# Patient Record
Sex: Female | Born: 1955 | Race: White | Hispanic: No | Marital: Married
Health system: Midwestern US, Academic
[De-identification: ages and names within clinical notes are randomized; demographics above are authoritative.]

---

## 2017-05-02 MED ORDER — METOPROLOL TARTRATE 25 MG PO TAB
ORAL_TABLET | Freq: Two times a day (BID) | 3 refills
Start: 2017-05-02 — End: ?

## 2017-05-05 MED ORDER — ISOSORBIDE MONONITRATE 30 MG PO TB24
ORAL_TABLET | ORAL | 1 refills | 30.00000 days | Status: DC
Start: 2017-05-05 — End: 2018-10-13

## 2017-05-05 MED ORDER — CLOPIDOGREL 75 MG PO TAB
ORAL_TABLET | Freq: Every day | ORAL | 1 refills | 90.00000 days | Status: DC
Start: 2017-05-05 — End: 2017-07-17

## 2017-07-03 ENCOUNTER — Encounter: Admit: 2017-07-03 | Discharge: 2017-07-03 | Payer: Commercial Managed Care - PPO

## 2017-07-16 ENCOUNTER — Encounter: Admit: 2017-07-16 | Discharge: 2017-07-16 | Payer: Commercial Managed Care - PPO

## 2017-07-16 DIAGNOSIS — E785 Hyperlipidemia, unspecified: ICD-10-CM

## 2017-07-16 DIAGNOSIS — I2510 Atherosclerotic heart disease of native coronary artery without angina pectoris: Principal | ICD-10-CM

## 2017-07-16 DIAGNOSIS — I10 Essential (primary) hypertension: ICD-10-CM

## 2017-07-16 LAB — HEMOGLOBIN A1C: Lab: 9.1 — ABNORMAL HIGH (ref 4.5–6.5)

## 2017-07-16 LAB — AST (SGOT): Lab: 11 % — ABNORMAL LOW (ref 24–44)

## 2017-07-16 LAB — COMPREHENSIVE METABOLIC PANEL
Lab: 0.5
Lab: 11
Lab: 126
Lab: 13
Lab: 139
Lab: 145 — ABNORMAL HIGH (ref 80–115)
Lab: 16 — ABNORMAL HIGH (ref 0–14)
Lab: 24
Lab: 3.5
Lab: 73
Lab: 84
Lab: 9.9
Lab: 0.8 — ABNORMAL LOW (ref 33.0–37.0)
Lab: 16
Lab: 7.4 — ABNORMAL HIGH (ref 11.5–14.5)

## 2017-07-16 LAB — LIPID PROFILE
Lab: 257 g/dL — ABNORMAL HIGH (ref 150–200)
Lab: 42 10*3/uL (ref 150–400)
Lab: 484 % — ABNORMAL HIGH (ref 30–200)
Lab: 6 % — ABNORMAL HIGH (ref 0–5)

## 2017-07-16 LAB — CBC: Lab: 7.2

## 2017-07-16 LAB — THYROID STIMULATING HORMONE-TSH: Lab: 1.6

## 2017-07-16 LAB — ALT (SGPT): Lab: 13 FL — ABNORMAL HIGH (ref <100)

## 2017-07-17 ENCOUNTER — Ambulatory Visit: Admit: 2017-07-17 | Discharge: 2017-07-18 | Payer: Commercial Managed Care - PPO

## 2017-07-17 ENCOUNTER — Encounter: Admit: 2017-07-17 | Discharge: 2017-07-17 | Payer: Commercial Managed Care - PPO

## 2017-07-17 DIAGNOSIS — I10 Essential (primary) hypertension: ICD-10-CM

## 2017-07-17 DIAGNOSIS — I208 Other forms of angina pectoris: ICD-10-CM

## 2017-07-17 DIAGNOSIS — E785 Hyperlipidemia, unspecified: ICD-10-CM

## 2017-07-17 DIAGNOSIS — F329 Major depressive disorder, single episode, unspecified: ICD-10-CM

## 2017-07-17 DIAGNOSIS — I2510 Atherosclerotic heart disease of native coronary artery without angina pectoris: Principal | ICD-10-CM

## 2017-07-17 MED ORDER — ATORVASTATIN 80 MG PO TAB
80 mg | ORAL_TABLET | Freq: Every day | ORAL | 3 refills | Status: AC
Start: 2017-07-17 — End: 2018-11-03

## 2017-07-17 NOTE — Assessment & Plan Note
She is not having any angina and wants to simplify her medical program.  She is gradually been tapering off the Ranexa so we discontinued it today.  If she remains free of any angina symptoms she could potentially stop the Imdur as well.

## 2017-07-17 NOTE — Assessment & Plan Note
Unfortunately we did not get back to her pharmacy about refilling the atorvastatin and she has been off the statin for several months.  Her lipid profile shows that she definitely needs it so I got her a new prescription today.

## 2017-07-17 NOTE — Assessment & Plan Note
Blood pressure looks good on the current medical program.

## 2017-07-17 NOTE — Progress Notes
Date of Service: 07/17/2017    Nicole Pace is a 61 y.o. female.       HPI     Nicole Pace was in the [place] office today for follow-up regarding her coronary disease.  We have had to treat her with a fairly intensive antianginal program in the past for a pretty prominent vasoactive component.  This seems to have settled down because she has been tapering her Ranexa dosage, primarily because of the cost of the medication.  She is interested in trying to simplify her medical program so I did make some changes today.    Unfortunately her statin did not get refilled and she has been off of the drug for several months.  Her lipid profile does not look very good but I did go ahead and send in the prescription for more atorvastatin today.    She is not having any trouble at all with chest discomfort and she denies any breathlessness, palpitations, or lightheadedness.  She has had no TIA or stroke symptoms.         Vitals:    07/17/17 1308   BP: 128/82   Pulse: 91   Weight: 132 kg (291 lb)   Height: 1.715 m (5' 7.5)     Body mass index is 44.9 kg/m???.     Past Medical History  Patient Active Problem List    Diagnosis Date Noted   ??? 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 Venous insufficiency 04/08/2013   ??? Chest pain at rest 12/31/2012   ??? 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 CAD (coronary artery disease) 10/03/2010     05/02/09 Heart Cath Single vessel disease:  LAD(mid) 85% discrete lesion treated with 3.5x59mm Med[employer] endeavor sprint DES stent.  07/2010  Abnormal stress thallium  (antero-apical ischemia)  09/04/10 Heart Cath:  Non-obstructive Coronary disease, Mild ISR LAD(mid) tubular 20% lesion, LV EF 65%  12/2012 Cath:  LAD stent patent.  No significant obstructive lesions     ??? Hypertension 10/03/2010   ??? Hyperlipidemia 10/03/2010   ??? Diabetes mellitus, type 2 (HCC) 10/03/2010   ??? Depression 10/03/2010   ??? Sleep apnea 10/03/2010     Sleep study 09/13/09 Recommendations CPAP with 14cm Review of Systems   Constitution: Negative.   HENT: Negative.    Eyes: Negative.    Cardiovascular: Positive for leg swelling.   Respiratory: Positive for cough.    Endocrine: Negative.    Hematologic/Lymphatic: Negative.    Skin: Negative.    Musculoskeletal: Negative.    Gastrointestinal: Negative.    Genitourinary: Negative.    Neurological: Negative.    Psychiatric/Behavioral: Negative.    Allergic/Immunologic: Negative.        Physical Exam    Physical Exam   General Appearance: no distress   Skin: warm, no ulcers or xanthomas   Digits and Nails: no cyanosis or clubbing   Eyes: conjunctivae and lids normal, pupils are equal and round   Teeth/Gums/Palate: dentition unremarkable, no lesions   Lips & Oral Mucosa: no pallor or cyanosis   Neck Veins: normal JVP , neck veins are not distended   Thyroid: no nodules, masses, tenderness or enlargement   Chest Inspection: chest is normal in appearance   Respiratory Effort: breathing comfortably, no respiratory distress   Auscultation/Percussion: lungs clear to auscultation, no rales or rhonchi, no wheezing   PMI: PMI not enlarged or displaced   Cardiac Rhythm: regular rhythm and normal rate   Cardiac Auscultation: S1, S2 normal, no  rub, no gallop   Murmurs: no murmur   Peripheral Circulation: normal peripheral circulation   Carotid Arteries: normal carotid upstroke bilaterally, no bruits   Radial Arteries: normal symmetric radial pulses   Abdominal Aorta: no abdominal aortic bruit   Pedal Pulses: normal symmetric pedal pulses   Lower Extremity Edema: no lower extremity edema   Abdominal Exam: soft, non-tender, no masses, bowel sounds normal   Liver & Spleen: no organomegaly   Gait & Station: walks without assistance   Muscle Strength: normal muscle tone   Orientation: oriented to time, place and person   Affect & Mood: appropriate and sustained affect   Language and Memory: patient responsive and seems to comprehend information Neurologic Exam: neurological assessment grossly intact   Other: moves all extremities        Problems Addressed Today  Encounter Diagnoses   Name Primary?   ??? Coronary artery disease involving native coronary artery of native heart without angina pectoris    ??? Essential hypertension    ??? Hyperlipidemia, unspecified hyperlipidemia type    ??? Atypical angina (HCC)        Assessment and Plan       CAD (coronary artery disease)  She is not having any angina and wants to simplify her medical program.  She is gradually been tapering off the Ranexa so we discontinued it today.  If she remains free of any angina symptoms she could potentially stop the Imdur as well.    Hyperlipidemia  Unfortunately we did not get back to her pharmacy about refilling the atorvastatin and she has been off the statin for several months.  Her lipid profile shows that she definitely needs it so I got her a new prescription today.    Hypertension  Blood pressure looks good on the current medical program.      Current Medications (including today's revisions)  ??? aspirin EC 81 mg tablet Take 1 Tab by mouth daily. Take with food.   ??? atorvastatin (LIPITOR) 80 mg tablet Take 1 tablet by mouth daily.   ??? Cholecalciferol (Vitamin D3) (VITAMIN D-3) 1,000 unit chew Chew  by mouth.   ??? dimenhyDRINATE(+) 50 mg tab Take 50 mg by mouth every 6 hours as needed.   ??? diphenhydrAMINE (BENADRYL) 25 mg PO capsule Take 50 mg by mouth every 6 hours as needed.   ??? EPINEPHrine (EPIPEN) 1 mg/mL injection pen (2-Pack) Inject 0.3 mg into the muscle once as needed. Inject 0.3 mg (1 Pen) into thigh if needed for anaphylactic reaction. May repeat in 5-15 minutes if needed.   ??? erythromycin (E-MYCIN) 250 mg tablet Take 250 mg by mouth daily.   ??? ezetimibe (ZETIA) 10 mg tablet Take 1 tablet by mouth daily.   ??? FLUoxetine (PROZAC) 20 mg capsule Take 20 mg by mouth daily.     ??? glimepiride (AMARYL) 2 mg tablet Take 2 mg by mouth daily with breakfast. ??? HYDROcodone/acetaminophen (NORCO) 5/325 mg tablet Take 1 tablet by mouth every 4 hours as needed for Pain   ??? ibuprofen (ADVIL) 200 mg tablet Take 200 mg by mouth every 6 hours as needed for Pain. Take with food.   ??? INSULIN DETEMIR (LEVEMIR SC) Inject 25 Units into area(s) as directed at bedtime daily.   ??? isosorbide mononitrate SR (IMDUR) 30 mg tablet TAKE 1 TABLET BY MOUTH IN THE MORNING   ??? metFORMIN (GLUCOPHAGE) 1,000 mg tablet Take 1,000 mg by mouth twice daily with meals.   ??? mv,Ca,min/iron/FA/guarana/caff (ONE-A-DAY WOMEN'S  ACTIVE PO) Take  by mouth.   ??? nitroglycerin (NITROSTAT) 0.4 mg tablet Place 1 Tab under tongue every 5 minutes as needed for Chest Pain.   ??? omeprazole DR(+) (PRILOSEC) 20 mg capsule Take 20 mg by mouth twice daily.   ??? pregabalin (LYRICA) 50 mg capsule Take 50 mg by mouth three times daily.

## 2017-07-22 ENCOUNTER — Encounter: Admit: 2017-07-22 | Discharge: 2017-07-22 | Payer: Commercial Managed Care - PPO

## 2017-10-07 ENCOUNTER — Encounter: Admit: 2017-10-07 | Discharge: 2017-10-07 | Payer: Commercial Managed Care - PPO

## 2017-10-07 MED ORDER — EZETIMIBE 10 MG PO TAB
10 mg | ORAL_TABLET | Freq: Every day | ORAL | 10 refills | Status: AC
Start: 2017-10-07 — End: 2018-11-06

## 2018-10-13 ENCOUNTER — Encounter: Admit: 2018-10-13 | Discharge: 2018-10-13 | Payer: Commercial Managed Care - PPO

## 2018-10-13 ENCOUNTER — Ambulatory Visit: Admit: 2018-10-13 | Discharge: 2018-10-14 | Payer: Commercial Managed Care - PPO

## 2018-10-13 DIAGNOSIS — I10 Essential (primary) hypertension: ICD-10-CM

## 2018-10-13 DIAGNOSIS — E785 Hyperlipidemia, unspecified: ICD-10-CM

## 2018-10-13 DIAGNOSIS — F329 Major depressive disorder, single episode, unspecified: ICD-10-CM

## 2018-10-13 DIAGNOSIS — I209 Angina pectoris, unspecified: Principal | ICD-10-CM

## 2018-10-13 DIAGNOSIS — I2510 Atherosclerotic heart disease of native coronary artery without angina pectoris: Principal | ICD-10-CM

## 2018-10-13 MED ORDER — NITROGLYCERIN 0.4 MG SL SUBL
.4 mg | ORAL_TABLET | SUBLINGUAL | 3 refills | 9.00000 days | Status: AC | PRN
Start: 2018-10-13 — End: ?

## 2018-10-16 ENCOUNTER — Encounter: Admit: 2018-10-16 | Discharge: 2018-10-16 | Payer: Commercial Managed Care - PPO

## 2018-10-20 LAB — COMPREHENSIVE METABOLIC PANEL
Lab: 0.5
Lab: 0.9
Lab: 10
Lab: 10
Lab: 103
Lab: 113
Lab: 13
Lab: 140
Lab: 15 — ABNORMAL HIGH (ref 0–14)
Lab: 26
Lab: 274 — ABNORMAL HIGH (ref 80–115)
Lab: 3.4
Lab: 6.7
Lab: 65
Lab: 9.3

## 2018-10-20 LAB — HEMOGLOBIN A1C: Lab: 9.9 — ABNORMAL HIGH (ref 4.5–6.5)

## 2018-11-03 ENCOUNTER — Encounter: Admit: 2018-11-03 | Discharge: 2018-11-03 | Payer: Commercial Managed Care - PPO

## 2018-11-03 DIAGNOSIS — I208 Other forms of angina pectoris: ICD-10-CM

## 2018-11-03 DIAGNOSIS — I10 Essential (primary) hypertension: ICD-10-CM

## 2018-11-03 DIAGNOSIS — I2510 Atherosclerotic heart disease of native coronary artery without angina pectoris: Principal | ICD-10-CM

## 2018-11-03 DIAGNOSIS — E785 Hyperlipidemia, unspecified: ICD-10-CM

## 2018-11-03 MED ORDER — ATORVASTATIN 80 MG PO TAB
ORAL_TABLET | Freq: Every day | 0 refills | Status: AC
Start: 2018-11-03 — End: 2019-04-28

## 2018-11-06 ENCOUNTER — Encounter: Admit: 2018-11-06 | Discharge: 2018-11-06 | Payer: Commercial Managed Care - PPO

## 2018-11-06 MED ORDER — EZETIMIBE 10 MG PO TAB
ORAL_TABLET | Freq: Every day | 3 refills | Status: AC
Start: 2018-11-06 — End: 2019-05-24

## 2019-01-25 IMAGING — CR LOW_EXM
3 series · 3 of 3 positions shown · non-contrast
Comparison: none

[knee ap]
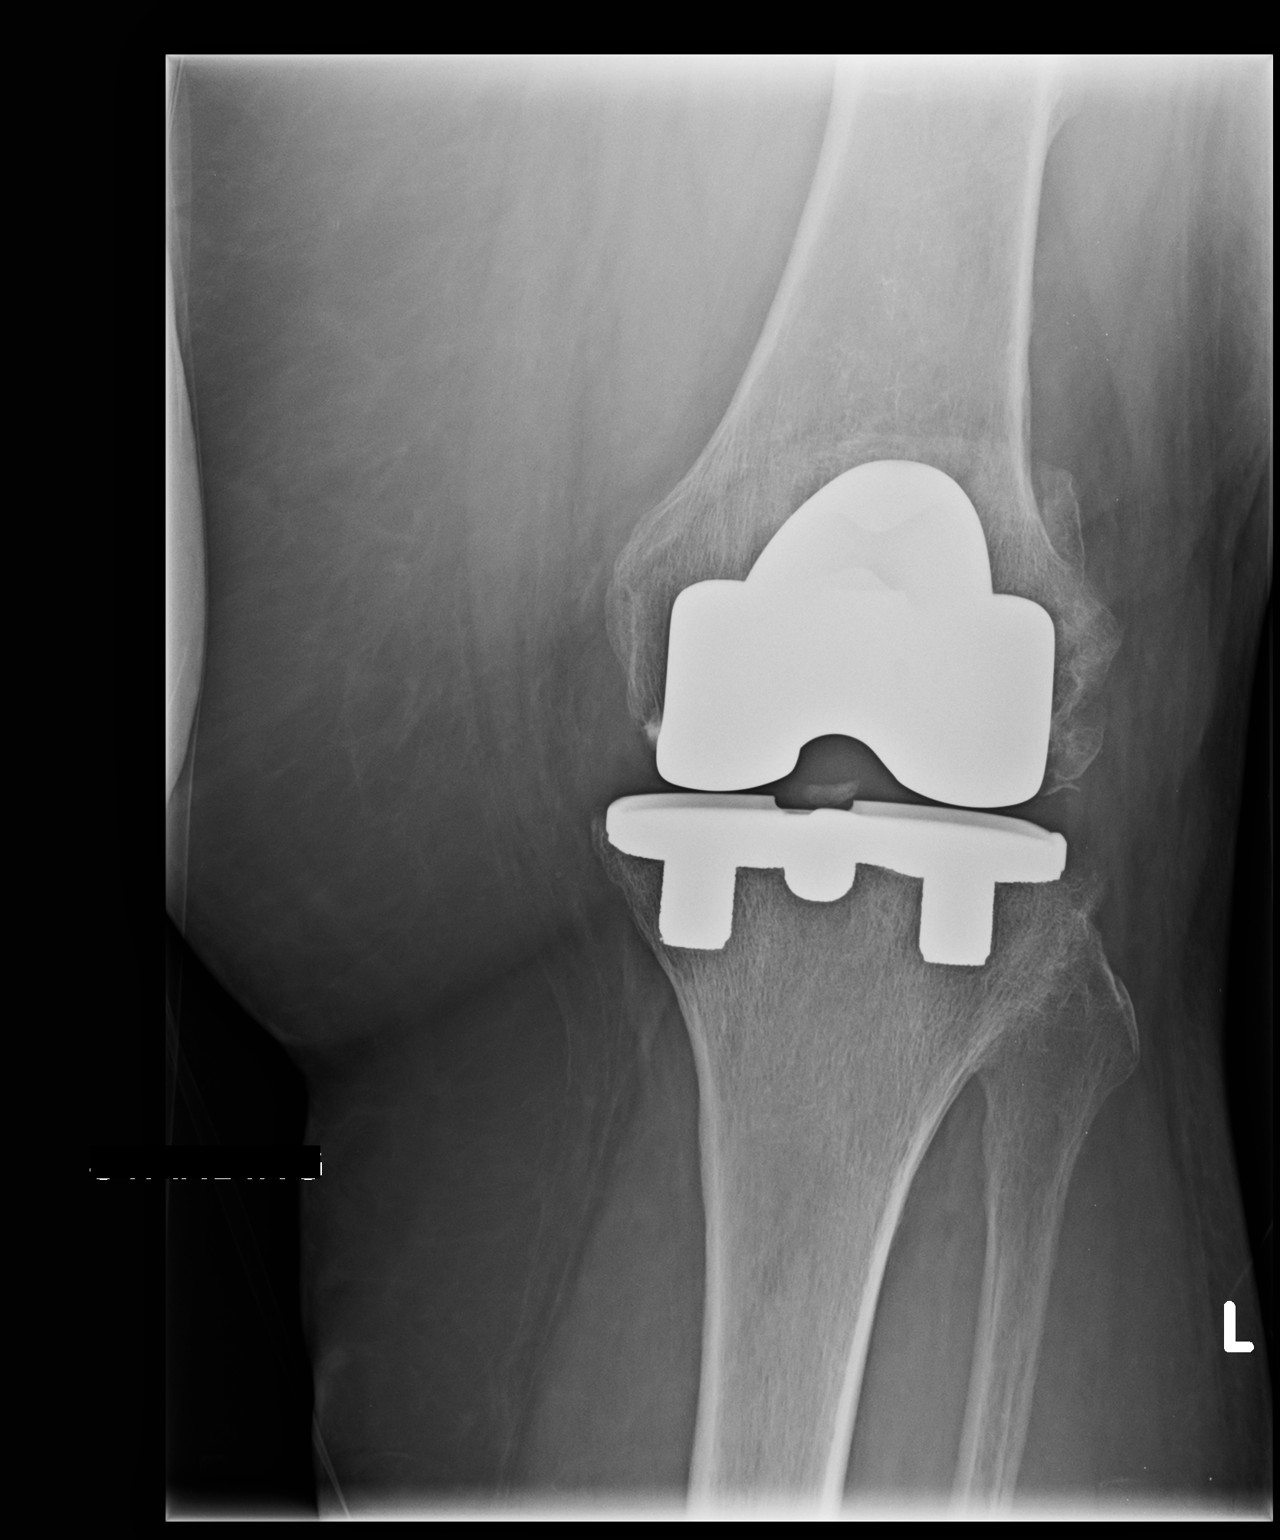

[knee sunrise]
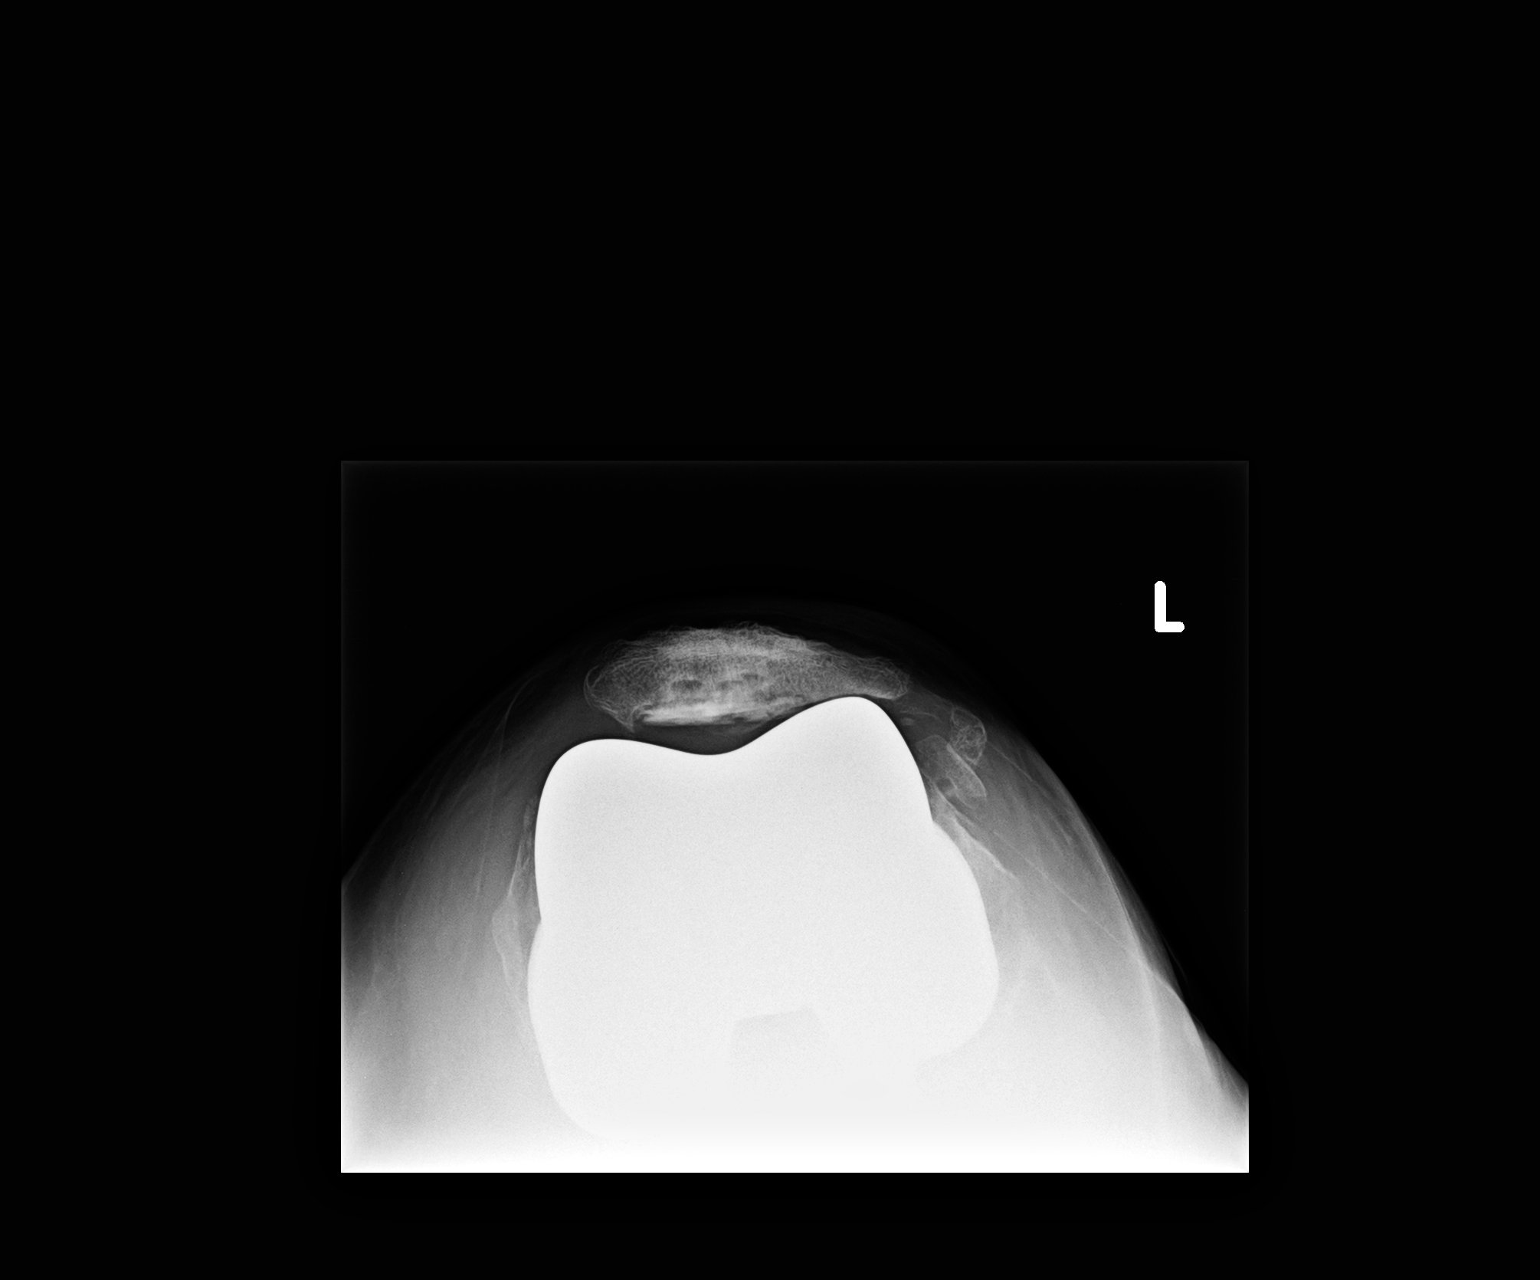

[knee lat]
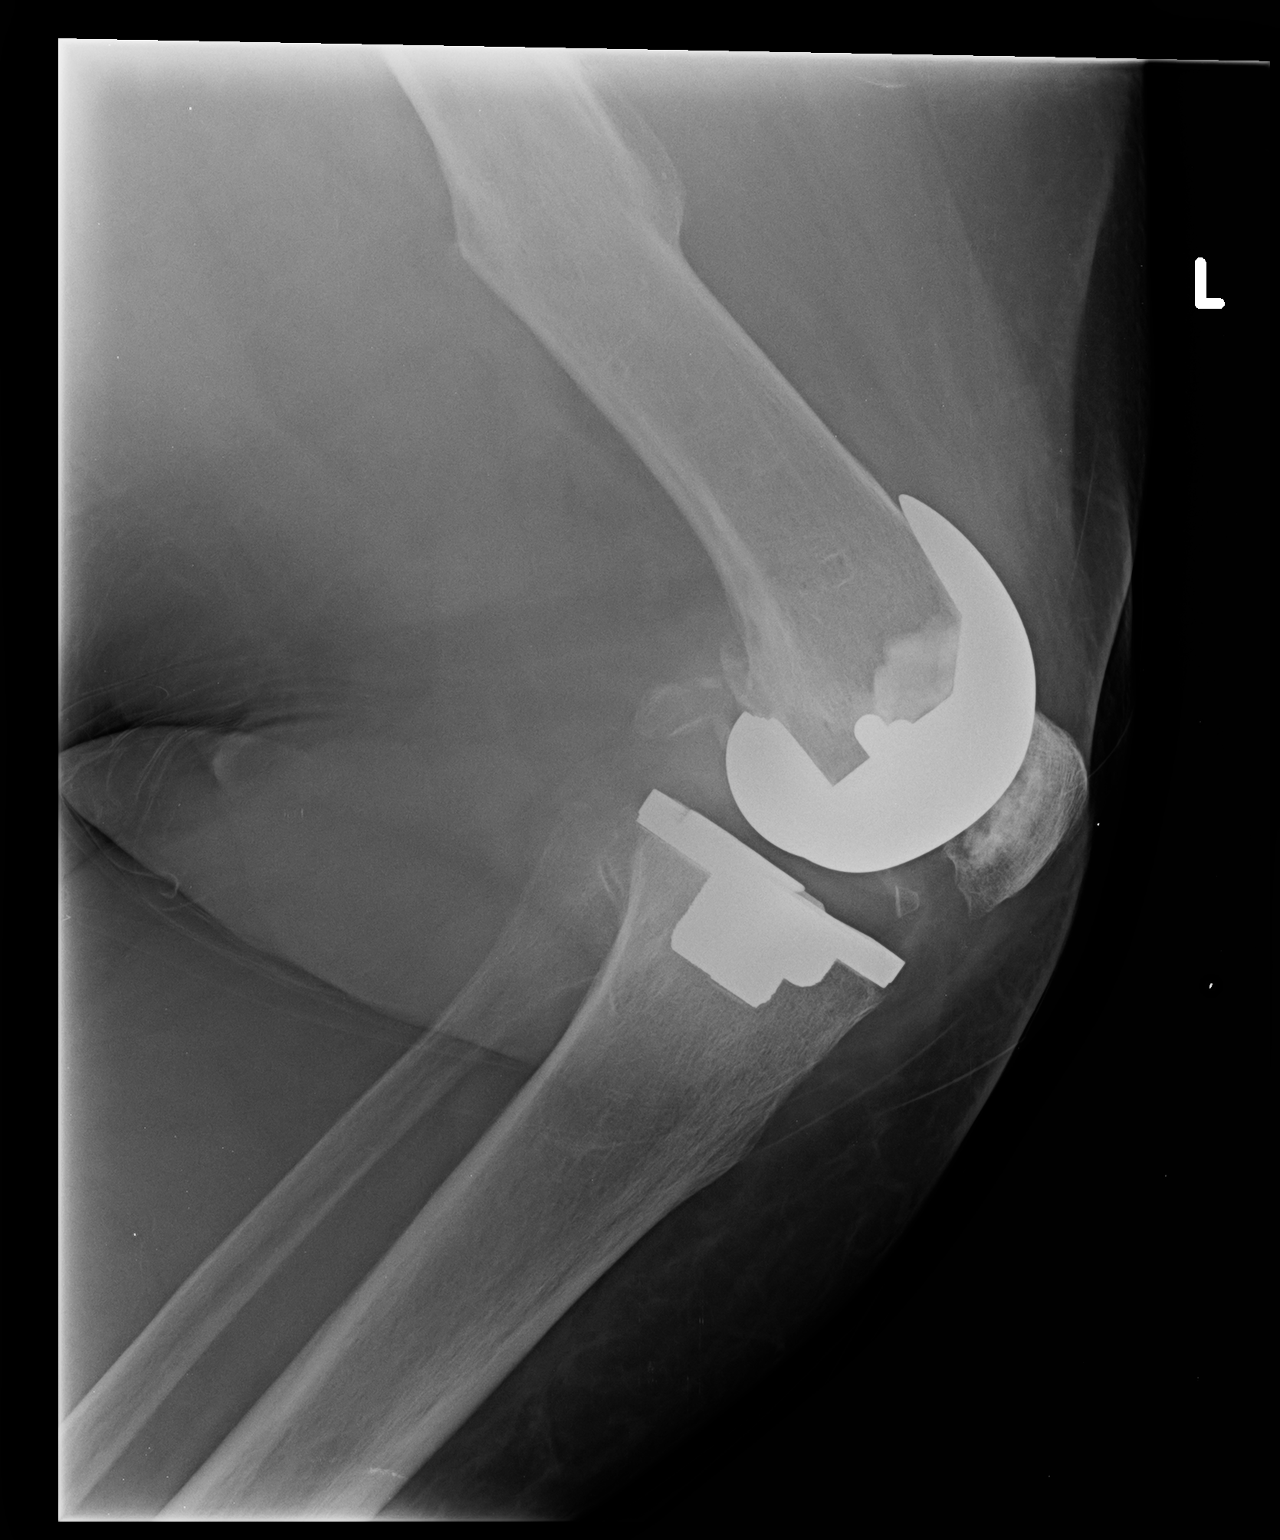

[3 of 3 positions shown; findings below may reference images not displayed]

DIAGNOSTIC STUDIES

EXAM

Left knee:

INDICATION

left knee pain
PT STATES KNEE POPPED WHILE STANDING. PT STATES SEVERE PAIN IN LT KNEE
SINCE INJURY MOSTLY WHEN SITTING. TKA LT KNEE IN 8038. SB/CK

COMPARISONS

03/10/2017

FINDINGS

Total knee arthroplasty. Healed fracture involving the distal femoral metaphysis. No significant
joint effusion.

IMPRESSION

No significant change compared to the prior study.

Follow up with an orthopedic consultation as clinically indicated.

## 2019-02-08 ENCOUNTER — Encounter: Admit: 2019-02-08 | Discharge: 2019-02-08 | Payer: Commercial Managed Care - PPO

## 2019-02-11 ENCOUNTER — Encounter: Admit: 2019-02-11 | Discharge: 2019-02-11 | Payer: Commercial Managed Care - PPO

## 2019-02-11 ENCOUNTER — Ambulatory Visit: Admit: 2019-02-11 | Discharge: 2019-02-12 | Payer: Commercial Managed Care - PPO

## 2019-02-11 DIAGNOSIS — F329 Major depressive disorder, single episode, unspecified: ICD-10-CM

## 2019-02-11 DIAGNOSIS — R079 Chest pain, unspecified: Principal | ICD-10-CM

## 2019-02-11 DIAGNOSIS — I2510 Atherosclerotic heart disease of native coronary artery without angina pectoris: Principal | ICD-10-CM

## 2019-02-11 DIAGNOSIS — E785 Hyperlipidemia, unspecified: ICD-10-CM

## 2019-02-11 DIAGNOSIS — I10 Essential (primary) hypertension: ICD-10-CM

## 2019-02-11 DIAGNOSIS — I208 Other forms of angina pectoris: ICD-10-CM

## 2019-02-11 DIAGNOSIS — R0789 Other chest pain: ICD-10-CM

## 2019-02-11 LAB — TROPONIN-I: Lab: 0

## 2019-02-11 MED ORDER — DOCUSATE SODIUM 100 MG PO CAP
100 mg | Freq: Every day | ORAL | 0 refills | Status: CN | PRN
Start: 2019-02-11 — End: ?

## 2019-02-11 MED ORDER — ASPIRIN 325 MG PO TAB
325 mg | Freq: Once | ORAL | 0 refills | Status: CN
Start: 2019-02-11 — End: ?

## 2019-02-11 MED ORDER — NITROGLYCERIN 0.3 MG SL SUBL
.4 mg | SUBLINGUAL | 0 refills | Status: CN | PRN
Start: 2019-02-11 — End: ?

## 2019-02-11 MED ORDER — ALUMINUM-MAGNESIUM HYDROXIDE 200-200 MG/5 ML PO SUSP
30 mL | ORAL | 0 refills | Status: CN | PRN
Start: 2019-02-11 — End: ?

## 2019-02-11 MED ORDER — ACETAMINOPHEN 325 MG PO TAB
650 mg | ORAL | 0 refills | Status: CN | PRN
Start: 2019-02-11 — End: ?

## 2019-02-11 MED ORDER — TEMAZEPAM 7.5 MG PO CAP
15 mg | Freq: Every evening | ORAL | 0 refills | Status: CN | PRN
Start: 2019-02-11 — End: ?

## 2019-02-11 NOTE — Progress Notes
Date of Service: 02/11/2019    Nicole Pace is a 63 y.o. female.       HPI     Nicole Pace was in the [place] office today for a work in visit regarding chest pain.  At about 3 AM Monday she developed retrosternal pressure that radiated into the back and up into the neck.  She took a total of 3 sublingual nitroglycerin tablets about 10 minutes apart and eventually the third tablet caused the discomfort to dissipate to the point that she fell asleep.  She spoke to 1 of our nurses the following day who strongly recommended that she go to the emergency department should she have any recurrence.    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  Then again this morning she had another similar episode, again not lasting very long.    She says that the symptoms she has had this week do remind her of the symptoms she had before her LAD stent 10 years ago.    She is otherwise been doing okay.  She has been taking all of her medications as prescribed.  Her blood pressure has been doing okay and she denies any recent problems with palpitations, lightheadedness, or syncope.  She has had no TIA or stroke symptoms.  There is been no recent increase in peripheral edema or weight gain.         Vitals:    02/11/19 1011 02/11/19 1030   BP: (!) 120/92 120/86   BP Source: Arm, Left Upper Arm, Right Upper   Pulse: 92    SpO2: 98%    Weight: 118.9 kg (262 lb 3.2 oz)    Height: 1.702 m (5' 7)    PainSc: Two      Body mass index is 41.07 kg/m???.     Past Medical History  Patient Active Problem List    Diagnosis Date Noted   ??? 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 Venous insufficiency 04/08/2013   ??? Chest pain at rest 12/31/2012   ??? 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 CAD (coronary artery disease) 10/03/2010 We need to follow-up on this--I don't think she was on the current cholesterol-lowering meds, but if her numbers still look like this we need to start pre-authorization for [facility].      Current Medications (including today's revisions)  ??? acetaminophen (TYLENOL) 325 mg tablet Take 2 tablets by mouth as Needed.   ??? aspirin EC 81 mg tablet Take 1 Tab by mouth daily. Take with food.   ??? atorvastatin (LIPITOR) 80 mg tablet TAKE 1 TABLET BY MOUTH EVERY DAY   ??? diclofenac (VOLTAREN) 1 % topical gel Apply 4 g topically to affected area four times daily.   ??? dimenhyDRINATE(+) 50 mg tab Take 50 mg by mouth every 6 hours as needed.   ??? diphenhydrAMINE (BENADRYL) 25 mg PO capsule Take 50 mg by mouth every 6 hours as needed.   ??? EPINEPHrine (EPIPEN) 1 mg/mL injection pen (2-Pack) Inject 0.3 mg into the muscle once as needed. Inject 0.3 mg (1 Pen) into thigh if needed for anaphylactic reaction. May repeat in 5-15 minutes if needed.   ??? ezetimibe (ZETIA) 10 mg tablet TAKE 1 TABLET BY MOUTH EVERY DAY   ??? FLUoxetine (PROZAC) 20 mg capsule Take 20 mg  by mouth daily.     ??? glimepiride (AMARYL) 4 mg tablet Take 1 tablet by mouth twice daily.   ??? HYDROcodone/acetaminophen (NORCO) 5/325 mg tablet Take 1 tablet by mouth every 4 hours as needed for Pain   ??? ibuprofen (ADVIL) 200 mg tablet Take 200 mg by mouth every 6 hours as needed for Pain. Take with food.   ??? INSULIN DETEMIR (LEVEMIR SC) Inject 28 Units under the skin twice daily.   ??? metFORMIN (GLUCOPHAGE) 1,000 mg tablet Take 1,000 mg by mouth twice daily with meals.   ??? mv,Ca,min/iron/FA/guarana/caff (ONE-A-DAY WOMEN'S ACTIVE PO) Take 2 tablets by mouth daily.   ??? nitroglycerin (NITROSTAT) 0.4 mg tablet Place one tablet under tongue every 5 minutes as needed for Chest Pain.   ??? omeprazole DR(+) (PRILOSEC) 20 mg capsule Take 20 mg by mouth twice daily.   ??? pregabalin (LYRICA) 50 mg capsule Take 50 mg by mouth three times daily.

## 2019-02-11 NOTE — Progress Notes
Belinda with [employer], [phone], confirmed benefits and eligibility:  Current and active since 12/23/2016, $1500 deductible with required co-insurance of 20% to coinsurance max OOP $2500, or Total annual out of pocket $4000, then plan will pay 100% of allowable charges.  Pre-certification through [employer], [phone], is required for [facility] [address]. Reference Belinda 02/11/2019 11:30    Clydie Braun, cardiology review nurse for [employer], after extensive clinical review gave approval for procedure, valid for a single date of service between 02/11/2019 - 05/12/2019.  Authorization #[id number]

## 2019-02-12 ENCOUNTER — Encounter: Admit: 2019-02-12 | Discharge: 2019-02-12 | Payer: Commercial Managed Care - PPO

## 2019-02-12 ENCOUNTER — Ambulatory Visit: Admit: 2019-02-12 | Discharge: 2019-02-12 | Payer: Commercial Managed Care - PPO

## 2019-02-12 ENCOUNTER — Encounter: Admit: 2019-02-12 | Discharge: 2019-02-13 | Payer: Commercial Managed Care - PPO

## 2019-02-12 DIAGNOSIS — E785 Hyperlipidemia, unspecified: ICD-10-CM

## 2019-02-12 DIAGNOSIS — F329 Major depressive disorder, single episode, unspecified: ICD-10-CM

## 2019-02-12 DIAGNOSIS — I2510 Atherosclerotic heart disease of native coronary artery without angina pectoris: Principal | ICD-10-CM

## 2019-02-12 DIAGNOSIS — R0789 Other chest pain: Secondary | ICD-10-CM

## 2019-02-12 DIAGNOSIS — I10 Essential (primary) hypertension: ICD-10-CM

## 2019-02-12 LAB — LIPID PROFILE
Lab: 130 mg/dL (ref <200)
Lab: 154 mg/dL — ABNORMAL HIGH (ref <150)
Lab: 31 mg/dL (ref 26–34)
Lab: 40 mg/dL — ABNORMAL LOW (ref >40)
Lab: 76 mg/dL — ABNORMAL HIGH (ref <100)
Lab: 90 mg/dL (ref 32.0–36.0)

## 2019-02-12 LAB — CBC
Lab: 15 % — ABNORMAL HIGH (ref 11–15)
Lab: 200 10*3/uL (ref >60)
Lab: 6.2 K/UL (ref 4.5–11.0)
Lab: 8.9 FL (ref >60)

## 2019-02-12 LAB — POC GLUCOSE
Lab: 194 mg/dL — ABNORMAL HIGH (ref 70–100)
Lab: 176 mg/dL — ABNORMAL HIGH (ref 70–100)

## 2019-02-12 LAB — BASIC METABOLIC PANEL: Lab: 139 MMOL/L (ref 137–147)

## 2019-02-12 MED ORDER — ONDANSETRON HCL (PF) 4 MG/2 ML IJ SOLN
4 mg | INTRAVENOUS | 0 refills | Status: DC | PRN
Start: 2019-02-12 — End: 2019-02-13
  Administered 2019-02-12: 4 mg via INTRAVENOUS

## 2019-02-12 MED ORDER — GLIMEPIRIDE 4 MG PO TAB
4 mg | Freq: Two times a day (BID) | ORAL | 0 refills | Status: DC
Start: 2019-02-12 — End: 2019-02-13
  Administered 2019-02-13: 02:00:00 4 mg via ORAL

## 2019-02-12 MED ORDER — MAGNESIUM SULFATE IN D5W 1 GRAM/100 ML IV PGBK
1 g | INTRAVENOUS | 0 refills | Status: DC | PRN
Start: 2019-02-12 — End: 2019-02-13

## 2019-02-12 MED ORDER — DIPHENHYDRAMINE HCL 25 MG PO CAP
25 mg | ORAL | 0 refills | Status: DC | PRN
Start: 2019-02-12 — End: 2019-02-13

## 2019-02-12 MED ORDER — SODIUM CHLORIDE 0.9 % IV SOLP
1000 mL | INTRAVENOUS | 0 refills | Status: DC
Start: 2019-02-12 — End: 2019-02-13

## 2019-02-12 MED ORDER — ASPIRIN 325 MG PO TAB
325 mg | Freq: Once | ORAL | 0 refills | Status: DC
Start: 2019-02-12 — End: 2019-02-12

## 2019-02-12 MED ORDER — POTASSIUM CHLORIDE 20 MEQ/15 ML PO LIQD
40-60 meq | NASOGASTRIC | 0 refills | Status: DC | PRN
Start: 2019-02-12 — End: 2019-02-13

## 2019-02-12 MED ORDER — POTASSIUM CHLORIDE 20 MEQ PO TBTQ
40-60 meq | ORAL | 0 refills | Status: DC | PRN
Start: 2019-02-12 — End: 2019-02-13

## 2019-02-12 MED ORDER — PANTOPRAZOLE 40 MG PO TBEC
40 mg | Freq: Two times a day (BID) | ORAL | 0 refills | Status: DC
Start: 2019-02-12 — End: 2019-02-13
  Administered 2019-02-13: 03:00:00 40 mg via ORAL

## 2019-02-12 MED ORDER — ASPIRIN 81 MG PO TBEC
81 mg | Freq: Every day | ORAL | 0 refills | Status: DC
Start: 2019-02-12 — End: 2019-02-13

## 2019-02-12 MED ORDER — NITROGLYCERIN 0.4 MG SL SUBL
.4 mg | ORAL_TABLET | SUBLINGUAL | 3 refills | Status: CN | PRN
Start: 2019-02-12 — End: ?

## 2019-02-12 MED ORDER — DIPHENHYDRAMINE HCL 50 MG/ML IJ SOLN
25 mg | INTRAVENOUS | 0 refills | Status: DC | PRN
Start: 2019-02-12 — End: 2019-02-13

## 2019-02-12 MED ORDER — FLUOXETINE 20 MG PO CAP
20 mg | Freq: Every day | ORAL | 0 refills | Status: DC
Start: 2019-02-12 — End: 2019-02-13

## 2019-02-12 MED ORDER — POTASSIUM CHLORIDE IN WATER 10 MEQ/50 ML IV PGBK
10 meq | INTRAVENOUS | 0 refills | Status: DC | PRN
Start: 2019-02-12 — End: 2019-02-13

## 2019-02-12 MED ORDER — ASPIRIN-ACETAMINOPHEN-CAFFEINE 250-250-65 MG PO TAB
1 | Freq: Once | ORAL | 0 refills | Status: CP
Start: 2019-02-12 — End: ?
  Administered 2019-02-12: 1 via ORAL

## 2019-02-12 MED ORDER — ASPIRIN 81 MG PO TBEC
81 mg | Freq: Every day | ORAL | 0 refills | Status: DC
Start: 2019-02-12 — End: 2019-02-12

## 2019-02-12 MED ORDER — PREGABALIN 50 MG PO CAP
50 mg | Freq: Three times a day (TID) | ORAL | 0 refills | Status: DC
Start: 2019-02-12 — End: 2019-02-13
  Administered 2019-02-12 – 2019-02-13 (×2): 50 mg via ORAL

## 2019-02-12 MED ORDER — DIPHENHYDRAMINE HCL 50 MG/ML IJ SOLN
25 mg | Freq: Once | INTRAVENOUS | 0 refills | Status: CP
Start: 2019-02-12 — End: ?
  Administered 2019-02-13: 02:00:00 25 mg via INTRAVENOUS

## 2019-02-12 MED ORDER — NITROGLYCERIN 0.4 MG SL SUBL
.4 mg | SUBLINGUAL | 0 refills | Status: DC | PRN
Start: 2019-02-12 — End: 2019-02-13

## 2019-02-12 MED ORDER — ACETAMINOPHEN 325 MG PO TAB
650 mg | ORAL | 0 refills | Status: DC | PRN
Start: 2019-02-12 — End: 2019-02-13
  Administered 2019-02-12: 22:00:00 650 mg via ORAL

## 2019-02-12 MED ORDER — DOCUSATE SODIUM 100 MG PO CAP
100 mg | Freq: Every day | ORAL | 0 refills | Status: DC | PRN
Start: 2019-02-12 — End: 2019-02-13

## 2019-02-12 MED ORDER — TEMAZEPAM 15 MG PO CAP
15 mg | Freq: Every evening | ORAL | 0 refills | Status: DC | PRN
Start: 2019-02-12 — End: 2019-02-13

## 2019-02-12 MED ORDER — PROCHLORPERAZINE EDISYLATE 5 MG/ML IJ SOLN
10 mg | Freq: Once | INTRAVENOUS | 0 refills | Status: CP
Start: 2019-02-12 — End: ?
  Administered 2019-02-13: 02:00:00 10 mg via INTRAVENOUS

## 2019-02-12 MED ORDER — ATORVASTATIN 40 MG PO TAB
80 mg | Freq: Every evening | ORAL | 0 refills | Status: DC
Start: 2019-02-12 — End: 2019-02-13
  Administered 2019-02-13: 03:00:00 80 mg via ORAL

## 2019-02-12 MED ORDER — MAGNESIUM SULFATE IN D5W 1 GRAM/100 ML IV PGBK
1 g | Freq: Once | INTRAVENOUS | 0 refills | Status: CP
Start: 2019-02-12 — End: ?
  Administered 2019-02-13: 02:00:00 1 g via INTRAVENOUS

## 2019-02-12 MED ORDER — EZETIMIBE 10 MG PO TAB
10 mg | Freq: Every day | ORAL | 0 refills | Status: DC
Start: 2019-02-12 — End: 2019-02-13
  Administered 2019-02-13: 03:00:00 10 mg via ORAL

## 2019-02-12 MED ORDER — INSULIN ASPART 100 UNIT/ML SC FLEXPEN
0-6 [IU] | Freq: Before meals | SUBCUTANEOUS | 0 refills | Status: DC
Start: 2019-02-12 — End: 2019-02-13

## 2019-02-12 MED ORDER — MAGNESIUM CHLORIDE 64 MG PO TBER
535 mg | ORAL | 0 refills | Status: DC | PRN
Start: 2019-02-12 — End: 2019-02-13

## 2019-02-12 MED ORDER — INSULIN DETEMIR U-100 100 UNIT/ML SC SOLN
28 [IU] | Freq: Two times a day (BID) | SUBCUTANEOUS | 0 refills | Status: DC
Start: 2019-02-12 — End: 2019-02-12

## 2019-02-12 MED ORDER — ALUMINUM-MAGNESIUM HYDROXIDE 200-200 MG/5 ML PO SUSP
30 mL | ORAL | 0 refills | Status: DC | PRN
Start: 2019-02-12 — End: 2019-02-13

## 2019-02-12 MED ORDER — INSULIN GLARGINE 100 UNIT/ML (3 ML) SC INJ PEN
28 [IU] | Freq: Two times a day (BID) | SUBCUTANEOUS | 0 refills | Status: DC
Start: 2019-02-12 — End: 2019-02-13
  Administered 2019-02-13: 03:00:00 28 [IU] via SUBCUTANEOUS

## 2019-02-12 MED ORDER — PRASUGREL 10 MG PO TAB
10 mg | Freq: Every day | ORAL | 0 refills | Status: DC
Start: 2019-02-12 — End: 2019-02-13

## 2019-02-12 NOTE — Progress Notes
CARDIOPULMONARY REHABILITATION  INPATIENT ASSESSMENT    Cardiac Rehabilitation Staff: Izora Ribas Discharge Date:     Demographics  Pre-admit Dx: Coronary Artery Disease Date of Admission: 02/12/2019     Room: [facility] CVLAB RM/HC2 CVLAB BD DOB:  December 02, 1956   Insurance: Primary: [employer]  Secondary: unknown   Address: [address]  [place] [address]   Patient Phone:  [phone] (home) [phone] (work)   Marital Status: Married  Occupation: Unknown   ED Contact: Corena Pilgrim  ED Phone #: [phone]   CTS: NA  Cardiologist: Bufford Buttner     Cardiac Procedures and Events         PCI: 02/12/19        EF: 65 %          Risk Factors  Risk Factors: Hypertension, Hyperlipidemia, Obesity, Diabetes-type II  BP: (!) 135/90  Height: 170.2 cm (67)  Weight: 117.5 kg (259 lb 0.7 oz)  BMI (Calculated): 40.57      Medical History   has a past medical history of CAD (coronary artery disease) (10/03/2010), Depression (10/03/2010), Diabetes mellitus type II (10/03/2010), Hyperlipidemia (10/03/2010), and Hypertension (10/03/2010).    Labs  Cholesterol   Date Value Ref Range Status   02/12/2019 130 <200 MG/DL Final     Triglycerides   Date Value Ref Range Status   02/12/2019 154 (H) <150 MG/DL Final     HDL   Date Value Ref Range Status   02/12/2019 40 (L) >40 MG/DL Final     LDL   Date Value Ref Range Status   02/12/2019 76 <100 mg/dL Final     Hemoglobin B2W   Date Value Ref Range Status   10/20/2018 9.9 (H) 4.5 - 6.5 Final     Troponin-I   Date Value Ref Range Status   02/11/2019 0.007  Final         Heart Resource Manual Given: 02/12/19     Teaching Completed: 02/12/19     Outpatient Cardiopulmonary Rehabilitation    Outpatient Cardic Rehab: Yes    Referral Faxed to:   [facility], [place]   Date Faxed: 02/12/19    Location: [facility]    If [place], Sent to Staff:            Verne Carrow, RN  02/12/2019

## 2019-02-12 NOTE — Discharge Instructions - Pharmacy
Physician Discharge Summary      Name: Nicole Pace  Medical Record Number: [id number]        Account Number:  [id number]  Date Of Birth:  [birth year]                         Age:  63 years   Admit date:  02/12/2019                     Discharge date:  02/13/2019    Attending Physician:  Myriam Jacobson, MD                Service: Cardiology-Interventional    Physician Summary completed by: Suzy Bouchard, PA-C     Reason for hospitalization: Coronary angiogram     Significant PMH:   Medical History:   Diagnosis Date   ??? CAD (coronary artery disease) 10/03/2010   ??? Depression 10/03/2010   ??? Diabetes mellitus type II 10/03/2010   ??? Hyperlipidemia 10/03/2010   ??? Hypertension 10/03/2010      Allergies: Nuts; Other [unclassified drug]; Artificial sweetner; Flu vaccine [influenza virus vaccines]; Morphine; and Tessalon [benzonatate]    Admission Lab/Radiology studies notable for:   Hematology:    Lab Results   Component Value Date    HGB 12.1 02/13/2019    HCT 36.3 02/13/2019    PLTCT 193 02/13/2019    WBC 5.5 02/13/2019    NEUT 59 01/08/2013    ANC 3.62 01/08/2013    ALC 1.84 01/08/2013    MONA 8 01/08/2013    AMC 0.46 01/08/2013    ABC 0.03 01/08/2013    MCV 85.8 02/13/2019    MCHC 33.2 02/13/2019    MPV 8.6 02/13/2019    RDW 15.7 02/13/2019   , Coagulation:    Lab Results   Component Value Date    PTT 34.9 01/08/2013    INR 1.1 01/08/2013   , General Chemistry:    Lab Results   Component Value Date    NA 141 02/13/2019    K 3.9 02/13/2019    CL 106 02/13/2019    GAP 7 02/13/2019    BUN 12 02/13/2019    CR 0.76 02/13/2019    GLU 171 02/13/2019    CA 9.0 02/13/2019    ALBUMIN 3.4 10/20/2018    MG 1.4 10/19/2015    TOTBILI 0.50 10/20/2018   , HgbA1C:   Lab Results   Component Value Date    HGBA1C 9.9 10/20/2018    and Lipid Profile:   Lab Results   Component Value Date    CHOL 130 02/12/2019    TRIG 154 02/12/2019    HDL 40 02/12/2019    LDL 76 02/12/2019    VLDL 31 02/12/2019 Hyperlipidemia, unspecified hyperlipidemia type; Atypical angina (HCC)      atorvastatin (LIPITOR) 80 mg tablet TAKE 1 TABLET BY MOUTH EVERY DAY  Qty: 90 tablet, Refills: 0    PRESCRIPTION TYPE:  Normal  Comments: Generic For:*LIPITOR 80MG  TAB  11/03/2018 12:51:17 PM  Associated Diagnoses: Coronary artery disease involving native coronary artery of native heart without angina pectoris; Essential hypertension; Hyperlipidemia, unspecified hyperlipidemia type; Atypical angina (HCC)      diclofenac (VOLTAREN) 1 % topical gel Apply 4 g topically to affected area four times daily.    PRESCRIPTION TYPE:  Historical Med      dimenhyDRINATE(+) 50 mg tab Take 50 mg by mouth every 6 hours as needed.  PRESCRIPTION TYPE:  Historical Med      diphenhydrAMINE (BENADRYL) 25 mg PO capsule Take 50 mg by mouth every 6 hours as needed.    PRESCRIPTION TYPE:  Historical Med      EPINEPHrine (EPIPEN) 1 mg/mL injection pen (2-Pack) Inject 0.3 mg into the muscle once as needed. Inject 0.3 mg (1 Pen) into thigh if needed for anaphylactic reaction. May repeat in 5-15 minutes if needed.    PRESCRIPTION TYPE:  Historical Med      ezetimibe (ZETIA) 10 mg tablet TAKE 1 TABLET BY MOUTH EVERY DAY  Qty: 30 tablet, Refills: 3    PRESCRIPTION TYPE:  Normal  Comments: Generic GNF:AOZHY 10MG  TAB  11/06/2018 8:20:18 AM      FLUoxetine (PROZAC) 20 mg capsule Take 20 mg by mouth daily.      PRESCRIPTION TYPE:  Historical Med      glimepiride (AMARYL) 4 mg tablet Take 1 tablet by mouth twice daily.    PRESCRIPTION TYPE:  Historical Med      HYDROcodone/acetaminophen (NORCO) 5/325 mg tablet Take 1 tablet by mouth every 4 hours as needed for Pain    PRESCRIPTION TYPE:  Historical Med      ibuprofen (ADVIL) 200 mg tablet Take 200 mg by mouth every 6 hours as needed for Pain. Take with food.    PRESCRIPTION TYPE:  Historical Med      INSULIN DETEMIR (LEVEMIR SC) Inject 28 Units under the skin twice daily.    PRESCRIPTION TYPE:  Historical Med

## 2019-02-13 LAB — CBC: Lab: 5.5 10*3/uL (ref 4.5–11.0)

## 2019-02-13 LAB — POC GLUCOSE: Lab: 209 mg/dL — ABNORMAL HIGH (ref 70–100)

## 2019-02-13 LAB — BASIC METABOLIC PANEL: Lab: 141 MMOL/L — ABNORMAL HIGH (ref >60)

## 2019-02-13 MED ORDER — METFORMIN 1,000 MG PO TAB
1000 mg | ORAL_TABLET | Freq: Two times a day (BID) | ORAL | 0 refills | Status: AC
Start: 2019-02-13 — End: ?

## 2019-02-13 MED ORDER — PRASUGREL 10 MG PO TAB
10 mg | ORAL_TABLET | Freq: Every day | ORAL | 3 refills | 30.00000 days | Status: AC
Start: 2019-02-13 — End: 2019-02-15

## 2019-02-13 NOTE — Progress Notes
Patient to be discharged to home with all belongings. No complaints of pain or discomfort.??? Discharge summary, med reconciliation and radial puncture care instructions given and explained to patient.???Patient verbalized understanding.??? Right radial puncture site dry and intact with no evidence of hematoma. Wrist splint applied.  Patient to follow up with [facility] with questions or concerns.??? Phone number provided. Patient transported to lobby by wheelchair.

## 2019-02-15 ENCOUNTER — Encounter: Admit: 2019-02-15 | Discharge: 2019-02-15 | Payer: Commercial Managed Care - PPO

## 2019-02-15 LAB — POC ACTIVATED CLOTTING TIME
Lab: 242 s
Lab: >40 s

## 2019-02-15 MED ORDER — TICAGRELOR 90 MG PO TAB
ORAL_TABLET | Freq: Two times a day (BID) | 11 refills | Status: AC
Start: 2019-02-15 — End: 2020-02-03

## 2019-02-16 ENCOUNTER — Encounter: Admit: 2019-02-16 | Discharge: 2019-02-16 | Payer: Commercial Managed Care - PPO

## 2019-02-16 ENCOUNTER — Emergency Department: Admit: 2019-02-16 | Discharge: 2019-02-16 | Payer: Commercial Managed Care - PPO

## 2019-02-16 DIAGNOSIS — R0789 Other chest pain: ICD-10-CM

## 2019-02-16 DIAGNOSIS — E7800 Pure hypercholesterolemia, unspecified: ICD-10-CM

## 2019-02-16 DIAGNOSIS — E785 Hyperlipidemia, unspecified: ICD-10-CM

## 2019-02-16 DIAGNOSIS — I10 Essential (primary) hypertension: ICD-10-CM

## 2019-02-16 DIAGNOSIS — I2510 Atherosclerotic heart disease of native coronary artery without angina pectoris: Principal | ICD-10-CM

## 2019-02-16 DIAGNOSIS — F329 Major depressive disorder, single episode, unspecified: ICD-10-CM

## 2019-02-16 DIAGNOSIS — R079 Chest pain, unspecified: Secondary | ICD-10-CM

## 2019-02-16 DIAGNOSIS — I872 Venous insufficiency (chronic) (peripheral): ICD-10-CM

## 2019-02-16 MED ORDER — HEPARIN (PORCINE) IN 5 % DEX 20,000 UNIT/500 ML (40 UNIT/ML) IV SOLP
0-2000 [IU]/h | INTRAVENOUS | 0 refills | Status: DC
Start: 2019-02-16 — End: 2019-02-17
  Administered 2019-02-17: 06:00:00 1000 [IU]/h via INTRAVENOUS

## 2019-02-16 MED ORDER — HEPARIN (PORCINE) BOLUS FOR CONTINUOUS INF (BAG)
20-40 [IU]/kg | INTRAVENOUS | 0 refills | Status: DC
Start: 2019-02-16 — End: 2019-02-17

## 2019-02-16 MED ORDER — HEPARIN (PORCINE) BOLUS FOR CONTINUOUS INF (BAG)
4000 [IU] | Freq: Once | INTRAVENOUS | 0 refills | Status: CP
Start: 2019-02-16 — End: ?

## 2019-02-16 MED ORDER — ASPIRIN 81 MG PO CHEW
324 mg | Freq: Once | ORAL | 0 refills | Status: CP
Start: 2019-02-16 — End: ?
  Administered 2019-02-17: 06:00:00 324 mg via ORAL

## 2019-02-16 MED ORDER — NITROGLYCERIN 0.4 MG SL SUBL
.4 mg | Freq: Once | SUBLINGUAL | 0 refills | Status: CP
Start: 2019-02-16 — End: ?
  Administered 2019-02-17: 06:00:00 0.4 mg via SUBLINGUAL

## 2019-02-17 ENCOUNTER — Inpatient Hospital Stay: Admit: 2019-02-17 | Discharge: 2019-02-17 | Payer: Commercial Managed Care - PPO

## 2019-02-17 ENCOUNTER — Encounter: Admit: 2019-02-17 | Discharge: 2019-02-17 | Payer: Commercial Managed Care - PPO

## 2019-02-17 ENCOUNTER — Inpatient Hospital Stay
Admit: 2019-02-17 | Discharge: 2019-02-20 | Disposition: A | Discharge status: Home / Self Care | Payer: Commercial Managed Care - PPO

## 2019-02-17 LAB — CBC AND DIFF
Lab: 0 10*3/uL (ref 0–0.20)
Lab: 0.3 10*3/uL (ref 0–0.45)
Lab: 10 K/UL — ABNORMAL HIGH (ref 4.5–11.0)
Lab: 13 g/dL (ref 12.0–15.0)
Lab: 15 % — ABNORMAL HIGH (ref 11–15)
Lab: 32 g/dL (ref 32.0–36.0)
Lab: 4.7 M/UL (ref 4.0–5.0)
Lab: 86 FL (ref 80–100)
Lab: 0 10*3/uL (ref 0–0.20)
Lab: 0.3 10*3/uL (ref 0–0.45)
Lab: 6.9 10*3/uL (ref 4.5–11.0)

## 2019-02-17 LAB — PROTIME INR (PT): Lab: 1 (ref 0.8–1.2)

## 2019-02-17 LAB — TROPONIN-I
Lab: 0 ng/mL — ABNORMAL LOW (ref 0.0–0.05)
Lab: 0 ng/mL (ref 0.0–0.05)

## 2019-02-17 LAB — BNP POC ER: Lab: <15 pg/mL (ref 0–100)

## 2019-02-17 LAB — COMPREHENSIVE METABOLIC PANEL
Lab: 105 MMOL/L (ref 98–110)
Lab: 140 MMOL/L (ref 137–147)
Lab: 3.8 MMOL/L (ref 3.5–5.1)
Lab: 0.5 mg/dL (ref 0.3–1.2)
Lab: 0.7 mg/dL (ref 0.4–1.00)
Lab: 104 MMOL/L (ref 98–110)
Lab: 106 U/L (ref 25–110)
Lab: 13 10*3/uL — ABNORMAL HIGH (ref 3–12)
Lab: 138 MMOL/L (ref 137–147)
Lab: 16 mg/dL (ref 7–25)
Lab: 21 MMOL/L (ref 21–30)
Lab: 3.5 g/dL (ref 3.5–5.0)
Lab: 3.9 MMOL/L (ref 3.5–5.1)
Lab: 6.1 g/dL (ref 6.0–8.0)
Lab: 8 U/L (ref 7–56)
Lab: 8.9 mg/dL — ABNORMAL HIGH (ref 8.5–10.6)
Lab: 9 U/L (ref 7–40)
Lab: >60 mL/min (ref >60)

## 2019-02-17 LAB — PTT (APTT)
Lab: 33 s (ref 24.0–36.5)
Lab: 36 s — ABNORMAL HIGH (ref >60)

## 2019-02-17 LAB — POC GLUCOSE
Lab: 216 mg/dL — ABNORMAL HIGH (ref 70–100)
Lab: 183 mg/dL — ABNORMAL HIGH (ref >60)

## 2019-02-17 LAB — POC TROPONIN
Lab: 0 ng/mL (ref 0.00–0.05)
Lab: 0 ng/mL (ref 0.00–0.05)

## 2019-02-17 MED ORDER — INSULIN GLARGINE 100 UNIT/ML (3 ML) SC INJ PEN
14 [IU] | Freq: Every evening | SUBCUTANEOUS | 0 refills | Status: DC
Start: 2019-02-17 — End: 2019-02-17
  Administered 2019-02-17: 09:00:00 14 [IU] via SUBCUTANEOUS

## 2019-02-17 MED ORDER — ACETAMINOPHEN/LIDOCAINE/ANTACID DS(#) 1:1:3  PO SUSP
30 mL | Freq: Once | ORAL | 0 refills | Status: CP
Start: 2019-02-17 — End: ?
  Administered 2019-02-17: 18:00:00 30 mL via ORAL

## 2019-02-17 MED ORDER — PATCH DOCUMENTATION - LIDOCAINE 5%
Freq: Two times a day (BID) | TRANSDERMAL | 0 refills | Status: DC
Start: 2019-02-17 — End: 2019-02-20

## 2019-02-17 MED ORDER — ACETAMINOPHEN 325 MG PO TAB
650 mg | ORAL | 0 refills | Status: DC | PRN
Start: 2019-02-17 — End: 2019-02-20
  Administered 2019-02-17 – 2019-02-20 (×9): 650 mg via ORAL

## 2019-02-17 MED ORDER — ENOXAPARIN 40 MG/0.4 ML SC SYRG
40 mg | Freq: Every day | SUBCUTANEOUS | 0 refills | Status: DC
Start: 2019-02-17 — End: 2019-02-20
  Administered 2019-02-18 – 2019-02-20 (×3): 40 mg via SUBCUTANEOUS

## 2019-02-17 MED ORDER — TICAGRELOR 90 MG PO TAB
90 mg | Freq: Two times a day (BID) | ORAL | 0 refills | Status: DC
Start: 2019-02-17 — End: 2019-02-20
  Administered 2019-02-17 – 2019-02-20 (×7): 90 mg via ORAL

## 2019-02-17 MED ORDER — SODIUM CHLORIDE 0.9 % IJ SOLN
50 mL | Freq: Once | INTRAVENOUS | 0 refills | Status: CP
Start: 2019-02-17 — End: ?
  Administered 2019-02-17: 22:00:00 50 mL via INTRAVENOUS

## 2019-02-17 MED ORDER — IOHEXOL 350 MG IODINE/ML IV SOLN
70 mL | Freq: Once | INTRAVENOUS | 0 refills | Status: CP
Start: 2019-02-17 — End: ?
  Administered 2019-02-17: 22:00:00 70 mL via INTRAVENOUS

## 2019-02-17 MED ORDER — LIDOCAINE 5 % TP PTMD
1 | Freq: Every day | TOPICAL | 0 refills | Status: DC
Start: 2019-02-17 — End: 2019-02-20
  Administered 2019-02-17 – 2019-02-19 (×3): 1 via TOPICAL

## 2019-02-17 MED ORDER — ATORVASTATIN 40 MG PO TAB
80 mg | Freq: Every day | ORAL | 0 refills | Status: DC
Start: 2019-02-17 — End: 2019-02-20
  Administered 2019-02-17 – 2019-02-20 (×4): 80 mg via ORAL

## 2019-02-17 MED ORDER — PREGABALIN 50 MG PO CAP
50 mg | Freq: Three times a day (TID) | ORAL | 0 refills | Status: DC
Start: 2019-02-17 — End: 2019-02-20
  Administered 2019-02-17 – 2019-02-20 (×9): 50 mg via ORAL

## 2019-02-17 MED ORDER — ISOSORBIDE MONONITRATE 30 MG PO TB24
30 mg | Freq: Every day | ORAL | 0 refills | Status: DC
Start: 2019-02-17 — End: 2019-02-17
  Administered 2019-02-17: 18:00:00 30 mg via ORAL

## 2019-02-17 MED ORDER — ASPIRIN 81 MG PO TBEC
81 mg | Freq: Every day | ORAL | 0 refills | Status: DC
Start: 2019-02-17 — End: 2019-02-20
  Administered 2019-02-17 – 2019-02-20 (×4): 81 mg via ORAL

## 2019-02-17 MED ORDER — INSULIN GLARGINE 100 UNIT/ML (3 ML) SC INJ PEN
28 [IU] | Freq: Every evening | SUBCUTANEOUS | 0 refills | Status: DC
Start: 2019-02-17 — End: 2019-02-20

## 2019-02-17 MED ORDER — EZETIMIBE 10 MG PO TAB
10 mg | Freq: Every day | ORAL | 0 refills | Status: DC
Start: 2019-02-17 — End: 2019-02-20
  Administered 2019-02-17 – 2019-02-20 (×4): 10 mg via ORAL

## 2019-02-17 MED ORDER — PANTOPRAZOLE 40 MG PO TBEC
40 mg | Freq: Every day | ORAL | 0 refills | Status: DC
Start: 2019-02-17 — End: 2019-02-20
  Administered 2019-02-18 – 2019-02-20 (×3): 40 mg via ORAL

## 2019-02-17 MED ORDER — ONDANSETRON HCL (PF) 4 MG/2 ML IJ SOLN
4 mg | INTRAVENOUS | 0 refills | Status: DC | PRN
Start: 2019-02-17 — End: 2019-02-20
  Administered 2019-02-18 – 2019-02-19 (×2): 4 mg via INTRAVENOUS

## 2019-02-17 MED ORDER — INSULIN ASPART 100 UNIT/ML SC FLEXPEN
0-6 [IU] | Freq: Before meals | SUBCUTANEOUS | 0 refills | Status: DC
Start: 2019-02-17 — End: 2019-02-20
  Administered 2019-02-17: 16:00:00 1 [IU] via SUBCUTANEOUS

## 2019-02-17 NOTE — Case Management (ED)
Case Management Admission Assessment    NAME:Nicole Pace                          MRN: [id number]             DOB:04-16-1956          AGE: 63 y.o.  ADMISSION DATE: 02/16/2019             DAYS ADMITTED: LOS: 1 day      Today???s Date: 02/17/2019    Source of Information: patient    This CM met with pt for assessment on this date.  Provided contact information and explanation of SW/NCM roles.  Reviewed [facility], Preparing for Discharge, and Preferred Provider Network hand-outs.  Provided opportunity for questions and discussion. Pt/family encouraged to contact Case Management team with questions and concerns during hospitalization and until patient is able to transition back to the patient's primary care physician.      Plan  Plan: Case Management Assessment, Assist PRN with SW/NCM Services, Discharge Planning for Home Anticipated     *Pt lives with her husband in a house in [place].  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  *DME:  Dan Humphreys, cane and stool riser  *Pt has used [facility] for home PT after surgery.  Pt would use again if needed.  *Denies placement or outpatient services.  *Husband will transport patient home.  *No CM needs at this time.  CM will follow for any discharge needs.      Patient Address/Phone  [address]  [place] [address]  [phone] (home) [phone] (work)    Emergency Contact  Extended Emergency Contact Information  Primary Emergency Contact: Marlou Starks [place]  Home Phone: [phone]  Mobile Phone: [phone]  Relation: Spouse  Secondary Emergency Contact: Rosanne Gutting [place]  Home Phone: [phone]  Mobile Phone: [phone]  Relation: Daughter    Healthcare Directive  Healthcare Directive: No, patient does not have a healthcare directive  Would patient like to fill out a (a new) Healthcare Directive?: No, patient declined      Transportation Does the patient need discharge transport arranged?: No  Transportation Name, Phone and Availability #1: husband Arlaine Mormando [phone]  Does the patient use Medicaid Transportation?: No    Expected Discharge Date  Expected Discharge Date: 02/18/19  Expected Discharge Time: 1700    Living Situation Prior to Admission  ? Living Arrangements  Type of Residence: Home, independent  Living Arrangements: Spouse/significant other  Financial risk analyst / Tub: Tub/Shower Unit  How many levels in the residence?: 2  Can patient live on one level if needed?: No  Does residence have entry and/or side stairs?: No  Assistance needed prior to admit or anticipated on discharge: No  Who provides assistance or could if needed?: husband  Are they in good health?: Yes  Can support system provide 24/7 care if needed?: Maybe  ? Level of Function   Prior level of function: Independent  ? Cognitive Abilities   Cognitive Abilities: Alert and Oriented, Engages in problem solving and planning, Participates in decision making    Financial Resources  ? Coverage  Primary Insurance: Theatre manager Coverage: RX    ? 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 Financial Assistance Needed?  none    Psychosocial Needs  ? Mental  Health  Mental Health History: Yes  Mental Health Provider: PCP - Lona Kettle  Mental Health Symptoms: Feeling depressed  ? Substance Use History  Substance Use History Screen: No  ? Other  none    Current/Previous Services  ? PCP  Lona Kettle, [phone], [phone]  ? Pharmacy    [facility] - [place], [place] - [address]  [address]  [place] [address]  Phone: [phone] Fax: [phone]    ? Durable Psychologist, educational at home: [employer], Single [employer], Toilet riser  ? Home Health  Receiving home health: In the past  Agency name: [facility]  Would patient use this agency again?: Yes

## 2019-02-17 NOTE — Progress Notes
Pt very symptomatic upon standing.  Pt dizzy and SOA.  BP remained stable, HR increased from 100 to 133.  Pt also complains of upset stomach and increased HA.

## 2019-02-17 NOTE — Care Coordination-Inpatient
This patient has been assigned to [id number]. For questions on this patient until 8am 02/26, please page [phone]. Following that, pepsase page Med [facility]

## 2019-02-17 NOTE — Progress Notes
General Progress Note    Name:  Nicole Pace   [id number] Date:  02/17/2019  Admission Date: 02/16/2019  LOS: 1 day                     Assessment/Plan:    Active Problems:    Chest pain, rule out acute myocardial infarction    63 year old female past medical history of CAD status post PCI with most recent stent last Friday, hypertension, diabetes, OSA and hyperlipidemia comes to the ED chief complaint of chest pain.  ???  Chest pain recurrent  ??? CAD status post PCI with most recent stent on 2/21 in the LAD for high-grade restenosis  ??? There are some typical features to her chest pain  ??? EKG no acute changes  ??? Troponin negative x3  ??? Patient continues to have chest pain and started on heparin drip in the ED which has been discontinued after cardiology evaluation.   ??? Patient chest pain completely resolved with the nitro.   ??? Started on Imdur per cardiology    ??? Cardiology following - no further ischemic evaluation recommended   ??? Continue aspirin and Brilinta  ???  Diabetes  ??? Resume PTA regimen   ??? Consistent carb diet  ??? Sliding scale  ???  Hyperlipidemia  ??? Continue home statin  ??????  Diet: cardiac diet   DVT PPx: Patient on heparin drip  IV Access:   Code Status: Full    Disposition: continue inpatient care   ________________________________________________________________________    Subjective  Nicole Pace is a 63 y.o. female.  No acute events overnight.  She continues to report 5 out of 10 chest pain.  Chest pain is constant and is not exacerbated by exertion, eating, inspiration.  She did not report relief when she took nitro.  No other concerns or complaints    ROS. 9 pt review of systems done and negative except for what is noted above.     Medications  Scheduled Meds:aspirin EC tablet 81 mg, 81 mg, Oral, QDAY  atorvastatin (LIPITOR) tablet 80 mg, 80 mg, Oral, QDAY  ezetimibe (ZETIA) tablet 10 mg, 10 mg, Oral, QDAY  heparin (porcine) BOLUS for continuous inf (bag) [phone] Units, 20-40 BNP POC <15.0 0 - 100 PG/ML   POC TROPONIN    Collection Time: 02/16/19 10:25 PM   Result Value Ref Range    Troponin-I-POC 0.01 0.00 - 0.05 NG/ML   POC GLUCOSE    Collection Time: 02/17/19  2:06 AM   Result Value Ref Range    Glucose, POC 216 (H) 70 - 100 MG/DL   COMPREHENSIVE METABOLIC PANEL    Collection Time: 02/17/19  2:20 AM   Result Value Ref Range    Sodium 138 137 - 147 MMOL/L    Potassium 3.9 3.5 - 5.1 MMOL/L    Chloride 104 98 - 110 MMOL/L    Glucose 255 (H) 70 - 100 MG/DL    Blood Urea Nitrogen 16 7 - 25 MG/DL    Creatinine 1.91 0.4 - 1.00 MG/DL    Calcium 8.9 8.5 - 47.8 MG/DL    Total Protein 6.1 6.0 - 8.0 G/DL    Total Bilirubin 0.5 0.3 - 1.2 MG/DL    Albumin 3.5 3.5 - 5.0 G/DL    Alk Phosphatase 295 25 - 110 U/L    AST (SGOT) 9 7 - 40 U/L    CO2 21 21 - 30 MMOL/L    ALT (SGPT) 8 7 -  56 U/L    Anion Gap 13 (H) 3 - 12    eGFR Non African American >60 >60 mL/min    eGFR African American >60 >60 mL/min   TROPONIN-I    Collection Time: 02/17/19  2:20 AM   Result Value Ref Range    Troponin-I 0.02 0.0 - 0.05 NG/ML   PTT (APTT)    Collection Time: 02/17/19  6:30 AM   Result Value Ref Range    APTT 36.8 (H) 24.0 - 36.5 SEC   CBC AND DIFF    Collection Time: 02/17/19  7:59 AM   Result Value Ref Range    White Blood Cells 6.9 4.5 - 11.0 K/UL    RBC 4.41 4.0 - 5.0 M/UL    Hemoglobin 12.4 12.0 - 15.0 GM/DL    Hematocrit 96.2 36 - 45 %    MCV 86.0 80 - 100 FL    MCH 28.2 26 - 34 PG    MCHC 32.8 32.0 - 36.0 G/DL    RDW 95.2 (H) 11 - 15 %    Platelet Count 221 150 - 400 K/UL    MPV 8.8 7 - 11 FL    Neutrophils 54 41 - 77 %    Lymphocytes 31 24 - 44 %    Monocytes 9 4 - 12 %    Eosinophils 5 0 - 5 %    Basophils 1 0 - 2 %    Absolute Neutrophil Count 3.70 1.8 - 7.0 K/UL    Absolute Lymph Count 2.20 1.0 - 4.8 K/UL    Absolute Monocyte Count 0.60 0 - 0.80 K/UL    Absolute Eosinophil Count 0.30 0 - 0.45 K/UL    Absolute Basophil Count 0.00 0 - 0.20 K/UL   POC GLUCOSE    Collection Time: 02/17/19  9:57 AM

## 2019-02-18 LAB — POC GLUCOSE
Lab: 186 mg/dL — ABNORMAL HIGH (ref 70–100)
Lab: 202 mg/dL — ABNORMAL HIGH (ref 70–100)
Lab: 234 mg/dL — ABNORMAL HIGH (ref 70–100)
Lab: 272 mg/dL — ABNORMAL HIGH (ref 70–100)

## 2019-02-18 LAB — CBC AND DIFF: Lab: 6.4 K/UL — ABNORMAL LOW (ref >60)

## 2019-02-18 LAB — COMPREHENSIVE METABOLIC PANEL: Lab: 140 MMOL/L — ABNORMAL LOW (ref 137–147)

## 2019-02-18 MED ORDER — ISOSORBIDE MONONITRATE 30 MG PO TB24
15 mg | Freq: Every day | ORAL | 0 refills | Status: DC
Start: 2019-02-18 — End: 2019-02-19
  Administered 2019-02-18 – 2019-02-19 (×2): 15 mg via ORAL

## 2019-02-18 MED ORDER — TRAMADOL 50 MG PO TAB
50 mg | Freq: Once | ORAL | 0 refills | Status: CP
Start: 2019-02-18 — End: ?
  Administered 2019-02-19: 06:00:00 50 mg via ORAL

## 2019-02-18 MED ORDER — IBUPROFEN 600 MG PO TAB
600 mg | Freq: Once | ORAL | 0 refills | Status: CP
Start: 2019-02-18 — End: ?
  Administered 2019-02-18: 23:00:00 600 mg via ORAL

## 2019-02-18 NOTE — Progress Notes
I have reviewed the notes, assessment, and/or procedures performed by Ryan Kilgore, RN and concur with his documentation unless otherwise noted.

## 2019-02-18 NOTE — ED Provider Notes
Nicole Pace is a 63 y.o. female.    Chief Complaint:  Chief Complaint   Patient presents with   ??? Chest Pain     L sided CP radiating to L neck and L side of back. Denies SOB/N/V. Heart Cath done 02/12/19 s/p stent placement at [place]       History of Present Illness:  Patient with history of coronary artery disease status post PCI on 2/21 for LAD in-stent restenosis presents with episodic chest discomfort multiple times per day x3 days.  Symptoms are not exacerbated by movement, deep breaths or activity.  They are improved with nitroglycerin.  She is also reported occasional palpitations.  No leg swelling.  No history of PE or DVT.  She reports compliance with Brilinta.  She had initially been started on Effient, but was switched to Brilinta due to intolerance.  She denies nausea.  She states the symptoms are similar to the chest pain she was experiencing prior to PCI, though with less severity.  Pain is mild to moderate in intensity and radiates to her left arm and back.      History provided by:  Patient and medical records  Chest Pain   Associated symptoms: headache and palpitations    Associated symptoms: no fever and no shortness of breath        Review of Systems:  Review of Systems   Constitutional: Negative.  Negative for fever.   HENT: Negative.    Eyes: Negative.    Respiratory: Positive for chest tightness. Negative for shortness of breath.    Cardiovascular: Positive for chest pain and palpitations. Negative for leg swelling.   Gastrointestinal: Negative.    Genitourinary: Negative.    Musculoskeletal: Negative.    Skin: Negative.    Neurological: Positive for headaches.   Psychiatric/Behavioral: Negative.    All other systems reviewed and are negative.      Allergies:  Nuts; Other [unclassified drug]; Artificial sweetner; Flu vaccine [influenza virus vaccines]; Morphine; Tessalon [benzonatate]; and Effient [prasugrel]    Past Medical History:  Medical History:   Diagnosis Date Substance and Sexual Activity   Drug Use No       Family History:  Family History   Problem Relation Age of Onset   ??? High Cholesterol Mother    ??? Hypertension Brother    ??? Hypertension Brother    ??? Diabetes Brother        Vitals:  ED Vitals    Date and Time T BP P RR SPO2P SPO2 User   02/17/19 0030 -- 122/67 89 11 PER MINUTE 90 96 % LC   02/17/19 0001 -- -- 104 18 PER MINUTE 103 97 % LC   02/17/19 0000 -- 138/98 -- -- -- -- LC   02/16/19 2350 -- 114/70 104 17 PER MINUTE 105 98 % LC   02/16/19 2346 -- 124/89 103 18 PER MINUTE 101 96 % LC   02/16/19 2344 -- 105/89 -- -- -- -- LC   02/16/19 2337 -- -- 94 13 PER MINUTE 93 96 % LC   02/16/19 2336 -- 105/89 -- -- -- -- LC   02/16/19 2335 -- 105/89 -- -- -- -- LC   02/16/19 2300 -- 133/80 85 19 PER MINUTE 85 95 % LC   02/16/19 2230 -- 137/73 89 19 PER MINUTE 89 95 % LC   02/16/19 2226 -- 132/91 84 16 PER MINUTE 84 98 % LC   02/16/19 2200 -- 150/99 91 17 PER MINUTE  90 97 % LC   02/16/19 2130 -- 139/86 84 17 PER MINUTE 83 96 % LC   02/16/19 2100 -- 137/88 84 17 PER MINUTE 84 97 % LC   02/16/19 2030 -- 146/93 89 12 PER MINUTE 89 98 % LC   02/16/19 1852 36.8 ???C (98.2 ???F) 150/104 -- 20 PER MINUTE 105 97 % JS          Physical Exam:  Physical Exam  Nursing note reviewed.   Constitutional:       General: She is not in acute distress.     Appearance: Normal appearance. She is not toxic-appearing or diaphoretic.   HENT:      Head: Normocephalic.      Nose: No congestion.      Mouth/Throat:      Mouth: Mucous membranes are moist.   Eyes:      Extraocular Movements: Extraocular movements intact.   Neck:      Musculoskeletal: Normal range of motion.   Cardiovascular:      Rate and Rhythm: Normal rate and regular rhythm.      Pulses: Normal pulses.      Heart sounds: Normal heart sounds.   Pulmonary:      Effort: Pulmonary effort is normal.      Breath sounds: Normal breath sounds.   Abdominal:      General: Abdomen is flat.      Palpations: Abdomen is soft. Musculoskeletal: Normal range of motion.         General: No swelling.      Right lower leg: No edema.      Left lower leg: No edema.   Skin:     General: Skin is warm and dry.      Capillary Refill: Capillary refill takes less than 2 seconds.   Neurological:      General: No focal deficit present.      Mental Status: She is alert and oriented to person, place, and time.   Psychiatric:         Mood and Affect: Mood normal.         Laboratory Results:  Labs Reviewed   COMPREHENSIVE METABOLIC PANEL - Abnormal       Result Value Ref Range Status    Sodium 140  137 - 147 MMOL/L Final    Potassium 3.8  3.5 - 5.1 MMOL/L Final    Chloride 105  98 - 110 MMOL/L Final    Glucose 150 (*) 70 - 100 MG/DL Final    Blood Urea Nitrogen 14  7 - 25 MG/DL Final    Creatinine 1.61  0.4 - 1.00 MG/DL Final    Calcium 9.6  8.5 - 10.6 MG/DL Final    Total Protein 7.0  6.0 - 8.0 G/DL Final    Total Bilirubin 0.4  0.3 - 1.2 MG/DL Final    Albumin 3.9  3.5 - 5.0 G/DL Final    Alk Phosphatase 118 (*) 25 - 110 U/L Final    AST (SGOT) 11  7 - 40 U/L Final    CO2 21  21 - 30 MMOL/L Final    ALT (SGPT) 10  7 - 56 U/L Final    Anion Gap 14 (*) 3 - 12 Final    eGFR Non African American >60  >60 mL/min Final    eGFR African American >60  >60 mL/min Final   CBC AND DIFF - Abnormal    White Blood Cells 10.4  4.5 -  11.0 K/UL Final    RBC 4.77  4.0 - 5.0 M/UL Final    Hemoglobin 13.5  12.0 - 15.0 GM/DL Final    Hematocrit 45.4  36 - 45 % Final    MCV 86.7  80 - 100 FL Final    MCH 28.4  26 - 34 PG Final    MCHC 32.7  32.0 - 36.0 G/DL Final    RDW 09.8 (*) 11 - 15 % Final    Platelet Count 279  150 - 400 K/UL Final    MPV 8.8  7 - 11 FL Final    Neutrophils 67  41 - 77 % Final    Lymphocytes 22 (*) 24 - 44 % Final    Monocytes 8  4 - 12 % Final    Eosinophils 3  0 - 5 % Final    Basophils 0  0 - 2 % Final    Absolute Neutrophil Count 6.90  1.8 - 7.0 K/UL Final    Absolute Lymph Count 2.30  1.0 - 4.8 K/UL Final Absolute Monocyte Count 0.80  0 - 0.80 K/UL Final    Absolute Eosinophil Count 0.30  0 - 0.45 K/UL Final    Absolute Basophil Count 0.00  0 - 0.20 K/UL Final   PROTIME INR (PT)    INR 1.0  0.8 - 1.2 Final   PTT (APTT)    APTT 33.1  24.0 - 36.5 SEC Final   POC TROPONIN    Troponin-I-POC 0.02  0.00 - 0.05 NG/ML Final   BNP POC ER    BNP POC <15.0  0 - 100 PG/ML Final   POC TROPONIN    Troponin-I-POC 0.01  0.00 - 0.05 NG/ML Final   CBC   POC TROPONIN   POC BNP   POC TROPONIN     POC Glucose (Download): (!) 202    Radiology Interpretation:    CTA CHEST WO/W CONTRAST+POST IMPRESSION   Final Result         1.  No evidence of pulmonary embolism.      2.  No evidence of thoracic aortic aneurysm or dissection.      3.  Mild cardio mainly and pulmonary venous congestion.      4.  At least mild coronary artery calcification with a presumed stent in the LAD.          Finalized by Golda Acre, M.D. on 02/17/2019 4:21 PM. Dictated by Golda Acre, M.D. on 02/17/2019 4:07 PM.         CHEST SINGLE VIEW   Final Result         Stable chest radiograph demonstrating no acute cardiopulmonary abnormalities.          Finalized by Darel Hong, M.D. on 02/17/2019 7:35 AM. Dictated by Darel Hong, M.D. on 02/17/2019 7:34 AM.               EKG:    Sinus rhythm.  Normal rate.  Old Q waves noted in inferior leads.  No ST segment depression or elevation.  No T wave inversions.    ED Course:  Patient was seen and evaluated by the ED attending.  Initial ECG unchanged from prior.  Troponin negative.  Chest x-ray was reviewed and was normal.  Patient was discussed with cardiology, who recommend admission to the cardiology service.  Patient was discussed with internal medicine for admission since cardiology is capped.  Patient's pain had resolved and was asymptomatic, however on reevaluation her  pain returns.  Due to this, she was given a triglyceride and was started on heparin drip out of concern for unstable angina.  Repeat troponin remained normal. ED Scoring:                             MDM  Reviewed: previous chart, nursing note and vitals  Reviewed previous: labs, ECG and x-ray  Interpretation: labs, ECG and x-ray  Consults: cardiology and admitting MD        Facility Administered Meds:  Medications   aspirin chewable tablet 324 mg (324 mg Oral Given 02/16/19 2334)   nitroglycerin (NITROSTAT) tablet 0.4 mg (0.4 mg Sublingual Given 02/16/19 2344)   heparin (porcine) BOLUS for continuous inf (bag) 4,000 Units (4,000 Units Intravenous Bolus from Bag 02/17/19 0001)         Clinical Impression:  Clinical Impression   Chest pain, unspecified type       Disposition/Follow up  ED Disposition     ED Disposition    Admit        No follow-up provider specified.    Medications:  Current Discharge Medication List          Procedure Notes:  Procedures      Attestation / Supervision:  I personally performed the E/M including history, physical exam, and MDM.      Lilli Light, MD

## 2019-02-19 LAB — POC GLUCOSE
Lab: 160 mg/dL — ABNORMAL HIGH (ref 70–100)
Lab: 204 mg/dL — ABNORMAL HIGH (ref 70–100)
Lab: 212 mg/dL — ABNORMAL HIGH (ref 70–100)
Lab: 270 mg/dL — ABNORMAL HIGH (ref 70–100)
Lab: 302 mg/dL — ABNORMAL HIGH (ref 70–100)

## 2019-02-19 LAB — CBC AND DIFF
Lab: 4.5 M/UL — ABNORMAL HIGH (ref 4.0–5.0)
Lab: 5.6 10*3/uL — ABNORMAL HIGH (ref >60)

## 2019-02-19 LAB — COMPREHENSIVE METABOLIC PANEL: Lab: 137 MMOL/L — ABNORMAL LOW (ref 137–147)

## 2019-02-19 MED ORDER — MAGNESIUM SULFATE IN D5W 1 GRAM/100 ML IV PGBK
1 g | Freq: Once | INTRAVENOUS | 0 refills | Status: CP
Start: 2019-02-19 — End: ?
  Administered 2019-02-20: 03:00:00 1 g via INTRAVENOUS

## 2019-02-19 MED ORDER — KETOROLAC 30 MG/ML (1 ML) IJ SOLN
30 mg | Freq: Once | INTRAVENOUS | 0 refills | Status: CP
Start: 2019-02-19 — End: ?
  Administered 2019-02-19: 20:00:00 30 mg via INTRAVENOUS

## 2019-02-19 MED ORDER — PROCHLORPERAZINE EDISYLATE 5 MG/ML IJ SOLN
10 mg | Freq: Once | INTRAVENOUS | 0 refills | Status: CP
Start: 2019-02-19 — End: ?
  Administered 2019-02-20: 03:00:00 10 mg via INTRAVENOUS

## 2019-02-19 MED ORDER — DIPHENHYDRAMINE HCL 25 MG PO CAP
25 mg | Freq: Once | ORAL | 0 refills | Status: CP
Start: 2019-02-19 — End: ?
  Administered 2019-02-20: 03:00:00 25 mg via ORAL

## 2019-02-20 ENCOUNTER — Encounter: Admit: 2019-02-12 | Discharge: 2019-02-12 | Payer: Commercial Managed Care - PPO

## 2019-02-20 ENCOUNTER — Encounter: Admit: 2019-02-20 | Discharge: 2019-02-20 | Payer: Commercial Managed Care - PPO

## 2019-02-20 DIAGNOSIS — I959 Hypotension, unspecified: ICD-10-CM

## 2019-02-20 DIAGNOSIS — R51 Headache: ICD-10-CM

## 2019-02-20 DIAGNOSIS — Z90710 Acquired absence of both cervix and uterus: ICD-10-CM

## 2019-02-20 DIAGNOSIS — Z794 Long term (current) use of insulin: ICD-10-CM

## 2019-02-20 DIAGNOSIS — R002 Palpitations: ICD-10-CM

## 2019-02-20 DIAGNOSIS — E785 Hyperlipidemia, unspecified: ICD-10-CM

## 2019-02-20 DIAGNOSIS — R42 Dizziness and giddiness: ICD-10-CM

## 2019-02-20 DIAGNOSIS — T463X5A Adverse effect of coronary vasodilators, initial encounter: ICD-10-CM

## 2019-02-20 DIAGNOSIS — I25110 Atherosclerotic heart disease of native coronary artery with unstable angina pectoris: ICD-10-CM

## 2019-02-20 DIAGNOSIS — Z7984 Long term (current) use of oral hypoglycemic drugs: ICD-10-CM

## 2019-02-20 DIAGNOSIS — G4733 Obstructive sleep apnea (adult) (pediatric): ICD-10-CM

## 2019-02-20 DIAGNOSIS — I2583 Coronary atherosclerosis due to lipid rich plaque: ICD-10-CM

## 2019-02-20 DIAGNOSIS — E119 Type 2 diabetes mellitus without complications: ICD-10-CM

## 2019-02-20 DIAGNOSIS — I10 Essential (primary) hypertension: ICD-10-CM

## 2019-02-20 DIAGNOSIS — Z96649 Presence of unspecified artificial hip joint: ICD-10-CM

## 2019-02-20 DIAGNOSIS — I25118 Atherosclerotic heart disease of native coronary artery with other forms of angina pectoris: Principal | ICD-10-CM

## 2019-02-20 DIAGNOSIS — Z955 Presence of coronary angioplasty implant and graft: ICD-10-CM

## 2019-02-20 LAB — COMPREHENSIVE METABOLIC PANEL
Lab: 139 MMOL/L — ABNORMAL LOW (ref 137–147)
Lab: 4.2 MMOL/L — ABNORMAL LOW (ref 3.5–5.1)

## 2019-02-20 LAB — CBC AND DIFF
Lab: 4.4 M/UL — ABNORMAL HIGH (ref >60)
Lab: 5.8 K/UL — ABNORMAL LOW (ref 4.5–11.0)

## 2019-02-20 LAB — POC GLUCOSE
Lab: 253 mg/dL — ABNORMAL HIGH (ref >60)
Lab: 233 mg/dL — ABNORMAL HIGH (ref 70–100)

## 2019-02-20 MED ORDER — INSULIN GLARGINE 100 UNIT/ML (3 ML) SC INJ PEN
32 [IU] | Freq: Every evening | SUBCUTANEOUS | 0 refills | Status: DC
Start: 2019-02-20 — End: 2019-02-20

## 2019-02-20 MED ORDER — INSULIN DETEMIR U-100 100 UNIT/ML SC SOLN
32 [IU] | Freq: Two times a day (BID) | SUBCUTANEOUS | 0 refills | Status: AC
Start: 2019-02-20 — End: ?

## 2019-02-20 MED ORDER — ACETAMINOPHEN 500 MG PO TAB
1000 mg | ORAL | 0 refills | Status: AC | PRN
Start: 2019-02-20 — End: ?

## 2019-03-10 ENCOUNTER — Encounter: Admit: 2019-03-10 | Discharge: 2019-03-10 | Payer: Commercial Managed Care - PPO

## 2019-03-11 ENCOUNTER — Ambulatory Visit: Admit: 2019-03-11 | Discharge: 2019-03-12 | Payer: Commercial Managed Care - PPO

## 2019-03-11 ENCOUNTER — Encounter: Admit: 2019-03-11 | Discharge: 2019-03-11 | Payer: Commercial Managed Care - PPO

## 2019-03-11 DIAGNOSIS — E785 Hyperlipidemia, unspecified: ICD-10-CM

## 2019-03-11 DIAGNOSIS — F329 Major depressive disorder, single episode, unspecified: ICD-10-CM

## 2019-03-11 DIAGNOSIS — I2510 Atherosclerotic heart disease of native coronary artery without angina pectoris: Principal | ICD-10-CM

## 2019-03-11 DIAGNOSIS — I10 Essential (primary) hypertension: ICD-10-CM

## 2019-03-11 DIAGNOSIS — R079 Chest pain, unspecified: ICD-10-CM

## 2019-03-13 ENCOUNTER — Encounter: Admit: 2019-03-13 | Discharge: 2019-03-13 | Payer: Commercial Managed Care - PPO

## 2019-03-23 ENCOUNTER — Ambulatory Visit: Admit: 2019-03-23 | Discharge: 2019-03-23 | Payer: Commercial Managed Care - PPO

## 2019-03-23 DIAGNOSIS — I10 Essential (primary) hypertension: ICD-10-CM

## 2019-03-23 DIAGNOSIS — I2510 Atherosclerotic heart disease of native coronary artery without angina pectoris: ICD-10-CM

## 2019-03-23 DIAGNOSIS — R0789 Other chest pain: Principal | ICD-10-CM

## 2019-03-24 ENCOUNTER — Encounter: Admit: 2019-03-24 | Discharge: 2019-03-24 | Payer: Commercial Managed Care - PPO

## 2019-03-24 DIAGNOSIS — I10 Essential (primary) hypertension: ICD-10-CM

## 2019-03-24 DIAGNOSIS — I2510 Atherosclerotic heart disease of native coronary artery without angina pectoris: ICD-10-CM

## 2019-03-24 DIAGNOSIS — R0789 Other chest pain: Principal | ICD-10-CM

## 2019-03-29 ENCOUNTER — Encounter: Admit: 2019-03-29 | Discharge: 2019-03-29 | Payer: Commercial Managed Care - PPO

## 2019-03-29 MED ORDER — RANOLAZINE 500 MG PO TB12
500 mg | ORAL_TABLET | Freq: Two times a day (BID) | ORAL | 3 refills | Status: DC
Start: 2019-03-29 — End: 2020-04-08

## 2019-04-01 ENCOUNTER — Encounter: Admit: 2019-04-01 | Discharge: 2019-04-01 | Payer: Commercial Managed Care - PPO

## 2019-04-01 NOTE — Telephone Encounter
-----   Message from Huel Coventry, RN sent at 04/01/2019  8:18 AM CDT -----    ----- Message -----  From: Laurence Aly, MD  Sent: 03/30/2019   7:47 PM CDT  To: Renetta Chalk, RN, Huel Coventry, RN    Let pt know this exam looks norma, and b e sure we have follow up arranged.

## 2019-04-28 ENCOUNTER — Encounter: Admit: 2019-04-28 | Discharge: 2019-04-28 | Payer: Commercial Managed Care - PPO

## 2019-04-28 DIAGNOSIS — E785 Hyperlipidemia, unspecified: ICD-10-CM

## 2019-04-28 DIAGNOSIS — I208 Other forms of angina pectoris: ICD-10-CM

## 2019-04-28 DIAGNOSIS — I2510 Atherosclerotic heart disease of native coronary artery without angina pectoris: Principal | ICD-10-CM

## 2019-04-28 DIAGNOSIS — I10 Essential (primary) hypertension: ICD-10-CM

## 2019-04-28 MED ORDER — ATORVASTATIN 80 MG PO TAB
ORAL_TABLET | Freq: Every day | 3 refills | Status: DC
Start: 2019-04-28 — End: 2020-06-09

## 2019-05-24 ENCOUNTER — Encounter: Admit: 2019-05-24 | Discharge: 2019-05-24

## 2019-05-24 MED ORDER — EZETIMIBE 10 MG PO TAB
ORAL_TABLET | Freq: Every day | 11 refills | Status: DC
Start: 2019-05-24 — End: 2020-05-26

## 2019-06-01 ENCOUNTER — Encounter: Admit: 2019-06-01 | Discharge: 2019-06-01

## 2019-06-01 NOTE — Telephone Encounter
Pt called and stated she is needing to have a hip revision surgery at [facility] and they are needing cardiac clearance. Will route to Dr. Ricard Dillon for recommendations.

## 2019-06-01 NOTE — Telephone Encounter
Patient had stent 2/21 so will need to have uninterrupted therapy until 8/21. I called the patient and she verbalized understanding of not holding Brilinta prior to 8/21. I asked if patient would like clearance sent anywhere and she would like note sent to her at [phone]. Sent to patient as requested.    Michiel Cowboy, MD  Lorrine Kin, RN 3 minutes ago (12:58 PM)      So she got a medicated stent to the mid-LAD in mid-February and shouldn't go off her anti-platelet therapy until mid-August at the soonest. It would be OK at that point, but she should resume DAPT.    Lorrine Kin, RN  You; Michiel Cowboy, MD 2 hours ago (10:58 AM)      Dr. Ricard Dillon,   Pt is requesting cardiac clearance for upcoming Hip Revision. Please let us know your recommendations.     Thanks   [employer]

## 2019-06-23 ENCOUNTER — Encounter: Admit: 2019-06-23 | Discharge: 2019-06-23

## 2019-06-23 NOTE — Telephone Encounter
Received a call from [facility] Fax [phone] asking for cardiac clearance note for left total hip revision scheduled 08/25/19. Faxed 06/01/19 cardiac clearance note to [facility] at [phone].

## 2019-08-10 ENCOUNTER — Encounter: Admit: 2019-08-10 | Discharge: 2019-08-10 | Payer: Commercial Managed Care - PPO

## 2019-08-19 ENCOUNTER — Encounter: Admit: 2019-08-19 | Discharge: 2019-08-19

## 2019-08-19 ENCOUNTER — Ambulatory Visit: Admit: 2019-08-19 | Discharge: 2019-08-19

## 2019-08-19 DIAGNOSIS — I2510 Atherosclerotic heart disease of native coronary artery without angina pectoris: Secondary | ICD-10-CM

## 2019-08-19 DIAGNOSIS — E1165 Type 2 diabetes mellitus with hyperglycemia: Secondary | ICD-10-CM

## 2019-08-19 DIAGNOSIS — F329 Major depressive disorder, single episode, unspecified: Secondary | ICD-10-CM

## 2019-08-19 DIAGNOSIS — I10 Essential (primary) hypertension: Secondary | ICD-10-CM

## 2019-08-19 DIAGNOSIS — E785 Hyperlipidemia, unspecified: Secondary | ICD-10-CM

## 2019-08-19 DIAGNOSIS — E7800 Pure hypercholesterolemia, unspecified: Secondary | ICD-10-CM

## 2019-08-19 NOTE — Assessment & Plan Note
Lab Results   Component Value Date    CHOL 130 02/12/2019    TRIG 154 (H) 02/12/2019    HDL 40 (L) 02/12/2019    LDL 76 02/12/2019    VLDL 31 02/12/2019    NONHDLCHOL 90 02/12/2019    CHOLHDLC 6 (H) 07/16/2017      LDL close to goal on the current medical therapy.

## 2019-08-19 NOTE — Assessment & Plan Note
Blood pressure looks fine on the current medical program.

## 2019-08-19 NOTE — Assessment & Plan Note
It has been 6 months since Nicole Pace's most recent coronary intervention so it is acceptable for her to hold the aspirin and Brilinta for hip surgery next week.  She will resume dual antiplatelet agents for the next 6 months and I will see her back in the clinic at that point.  We will plan to discontinue the Brilinta at that point.  She has not had any further angina since restarting Ranexa in April.

## 2019-08-19 NOTE — Progress Notes
Date of Service: 08/19/2019    Nicole Pace is a 63 y.o. female.       HPI     Nicole Pace was in the [facility] clinic today for preoperative cardiac evaluation.  She is scheduled for a left hip revision surgery next week.    She underwent coronary intervention back in February.  She had some residual chest discomfort but a myocardial perfusion study was normal and we attributed her symptoms to some degree of endothelial dysfunction and vasomotor angina.  She was started on Ranexa and has had no further problems with chest discomfort since April.    Because it has been 6 months since her coronary intervention we told her that it was okay to hold dual antiplatelet therapy for the surgery next week.  She will resume these medications postoperatively and continue the Brilinta for a total of 1 year post???stent.    Her risk factors are effectively managed at this point.  She denies any problems with palpitations or lightheadedness.  She has had no TIA or stroke symptoms.         Vitals:    08/19/19 1059   BP: 116/72   BP Source: Arm, Left Upper   Pulse: 67   Weight: 126.6 kg (279 lb)   Height: 1.715 m (5' 7.5)   PainSc: Zero     Body mass index is 43.05 kg/m???.     Past Medical History  Patient Active Problem List    Diagnosis Date Noted   ??? Headache 02/20/2019   ??? Lightheadedness 02/20/2019   ??? 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 Venous insufficiency 04/08/2013   ??? Chest pain, rule out acute myocardial infarction 12/31/2012   ??? 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 CAD (coronary artery disease) 10/03/2010     05/02/09 Heart Cath Single vessel disease:  LAD(mid) 85% discrete lesion treated with 3.5x32mm [employer] endeavor sprint DES stent.  Presented with intermittent chest pain and abnormal stress test.  07/2010  Abnormal stress thallium  (antero-apical ischemia)  09/04/10 Heart Cath:  Non-obstructive Coronary disease, Mild ISR LAD(mid) tubular 20% lesion, LV EF 65% 12/2012 Cath:  LAD stent patent.  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  Mild disease of the left circumflex artery and RCA.     ??? Hypertension 10/03/2010   ??? Hyperlipidemia 10/03/2010   ??? Diabetes mellitus, type 2 (HCC) 10/03/2010   ??? Depression 10/03/2010   ??? Sleep apnea 10/03/2010     Sleep study 09/13/09 Recommendations CPAP with 14cm           Review of Systems   Constitution: Negative.   HENT: Negative.    Eyes: Negative.    Cardiovascular: Negative.    Respiratory: Negative.    Endocrine: Negative.    Hematologic/Lymphatic: Negative.    Skin: Negative.    Musculoskeletal: Negative.    Gastrointestinal: Negative.    Genitourinary: Negative.    Neurological: Negative.    Psychiatric/Behavioral: Negative.    Allergic/Immunologic: Negative.        Physical Exam    Physical Exam   General Appearance: no distress   Skin: warm, no ulcers or xanthomas   Digits and Nails: no cyanosis or clubbing   Eyes: conjunctivae and lids normal, pupils are equal and round   Teeth/Gums/Palate: dentition unremarkable, no  lesions   Lips & Oral Mucosa: no pallor or cyanosis   Neck Veins: normal JVP , neck veins are not distended   Thyroid: no nodules, masses, tenderness or enlargement   Chest Inspection: chest is normal in appearance   Respiratory Effort: breathing comfortably, no respiratory distress   Auscultation/Percussion: lungs clear to auscultation, no rales or rhonchi, no wheezing   PMI: PMI not enlarged or displaced   Cardiac Rhythm: regular rhythm and normal rate   Cardiac Auscultation: S1, S2 normal, no rub, no gallop   Murmurs: no murmur   Peripheral Circulation: normal peripheral circulation   Carotid Arteries: normal carotid upstroke bilaterally, no bruits Radial Arteries: normal symmetric radial pulses   Abdominal Aorta: no abdominal aortic bruit   Pedal Pulses: normal symmetric pedal pulses   Lower Extremity Edema: no lower extremity edema   Abdominal Exam: soft, non-tender, no masses, bowel sounds normal   Liver & Spleen: no organomegaly   Gait & Station: walks without assistance   Muscle Strength: normal muscle tone   Orientation: oriented to time, place and person   Affect & Mood: appropriate and sustained affect   Language and Memory: patient responsive and seems to comprehend information   Neurologic Exam: neurological assessment grossly intact   Other: moves all extremities      Cardiovascular Studies    EKG:  Sinus rhythm, rate 66    Problems Addressed Today  Encounter Diagnoses   Name Primary?   ??? Coronary artery disease due to calcified coronary lesion Yes   ??? Essential hypertension    ??? Pure hypercholesterolemia    ??? Type 2 diabetes mellitus with hyperglycemia, unspecified whether long term insulin use (HCC)        Assessment and Plan       CAD (coronary artery disease)  It has been 6 months since Nicole Pace's most recent coronary intervention so it is acceptable for her to hold the aspirin and Brilinta for hip surgery next week.  She will resume dual antiplatelet agents for the next 6 months and I will see her back in the clinic at that point.  We will plan to discontinue the Brilinta at that point.  She has not had any further angina since restarting Ranexa in April.    Hypertension  Blood pressure looks fine on the current medical program.    Hyperlipidemia  Lab Results   Component Value Date    CHOL 130 02/12/2019    TRIG 154 (H) 02/12/2019    HDL 40 (L) 02/12/2019    LDL 76 02/12/2019    VLDL 31 02/12/2019    NONHDLCHOL 90 02/12/2019    CHOLHDLC 6 (H) 07/16/2017      LDL close to goal on the current medical therapy.    Diabetes mellitus, type 2 (HCC)  Her diabetes is managed by Dr. Herschell Pace and seems to be in good control at this point. Current Medications (including today's revisions)  ??? acetaminophen (TYLENOL) 500 mg tablet Take two tablets by mouth every 6-8 hours as needed.   ??? amitriptyline (ELAVIL) 25 mg tablet Take 25 mg by mouth at bedtime as needed.   ??? aspirin EC 81 mg tablet Take 1 Tab by mouth daily. Take with food.   ??? atorvastatin (LIPITOR) 80 mg tablet TAKE 1 TABLET BY MOUTH EVERY DAY   ??? diclofenac (VOLTAREN) 1 % topical gel Apply 4 g topically to affected area four times daily as needed.   ??? dimenhyDRINATE(+) 50 mg tab Take 50 mg  by mouth every 6 hours as needed.   ??? diphenhydrAMINE (BENADRYL) 25 mg PO capsule Take 50 mg by mouth every 6 hours as needed.   ??? EPINEPHrine (EPIPEN) 1 mg/mL injection pen (2-Pack) Inject 0.3 mg into the muscle once as needed. Inject 0.3 mg (1 Pen) into thigh if needed for anaphylactic reaction. May repeat in 5-15 minutes if needed.   ??? ezetimibe (ZETIA) 10 mg tablet TAKE 1 TABLET BY MOUTH EVERY DAY   ??? furosemide (LASIX) 20 mg tablet Take 20 mg by mouth as Needed.   ??? glimepiride (AMARYL) 4 mg tablet Take 1 tablet by mouth twice daily.   ??? ibuprofen (ADVIL) 200 mg tablet Take 600 mg by mouth every 6 hours as needed for Pain. Take with food.    ??? insulin detemir U-100(+) (LEVEMIR) 100 unit/mL vial Inject thirty two Units under the skin twice daily. (Patient taking differently: Inject 35 Units under the skin twice daily.)   ??? JARDIANCE 25 mg tablet Take 25 mg by mouth daily.   ??? metFORMIN (GLUCOPHAGE) 1,000 mg tablet Take one tablet by mouth twice daily with meals. Ok to resume on February 15, 2019   ??? nitroglycerin (NITROSTAT) 0.4 mg tablet Place one tablet under tongue every 5 minutes as needed for Chest Pain.   ??? omeprazole DR(+) (PRILOSEC) 20 mg capsule Take 20 mg by mouth twice daily.   ??? pregabalin (LYRICA) 200 mg capsule Take 1 capsule by mouth twice daily.   ??? ranolazine ER (RANEXA) 500 mg tablet Take one tablet by mouth twice daily. ??? ticagrelor (BRILINTA) 90 mg Take 180 mg (2 tabs) first dose then one tab twice daily thereafter (Patient taking differently: Take 90 mg by mouth twice daily.)   ??? vitamins, multi w/minerals 9 mg iron-400 mcg tab Take 2 tablets by mouth daily. At home patient takes chewable gummy for adult women

## 2019-08-19 NOTE — Assessment & Plan Note
Her diabetes is managed by Dr. Isidore Moos and seems to be in good control at this point.

## 2020-02-03 ENCOUNTER — Encounter: Admit: 2020-02-03 | Discharge: 2020-02-03 | Payer: Commercial Managed Care - PPO

## 2020-02-03 DIAGNOSIS — E785 Hyperlipidemia, unspecified: Secondary | ICD-10-CM

## 2020-02-03 DIAGNOSIS — F329 Major depressive disorder, single episode, unspecified: Secondary | ICD-10-CM

## 2020-02-03 DIAGNOSIS — I10 Essential (primary) hypertension: Secondary | ICD-10-CM

## 2020-02-03 DIAGNOSIS — I2510 Atherosclerotic heart disease of native coronary artery without angina pectoris: Secondary | ICD-10-CM

## 2020-02-03 DIAGNOSIS — E7800 Pure hypercholesterolemia, unspecified: Secondary | ICD-10-CM

## 2020-02-03 DIAGNOSIS — I872 Venous insufficiency (chronic) (peripheral): Secondary | ICD-10-CM

## 2020-02-03 NOTE — Assessment & Plan Note
She will increase her furosemide dose to 40 mg/day for a few days.  She will look into whether or not she can find some compression stockings that fit.

## 2020-02-03 NOTE — Progress Notes
Date of Service: 02/03/2020    Nicole Pace is a 64 y.o. female.       HPI     Nicole Pace was in the [facility] clinic today for follow-up regarding coronary disease.  She underwent hip replacement surgery in September and had no complications, going home the next day.    She has a big splint on her right hand from a third finger fracture.  She is still working, however.    She has had a little more trouble with fluid retention in her legs.  She has trouble with compression stockings because they cut into her upper calves.  She is trying to keep her legs elevated as much as possible but that is difficult for her.    She is not having any chest discomfort or breathlessness.  She denies any palpitations, syncope, or near syncope.  She has had no TIA or stroke symptoms.         Vitals:    02/03/20 1448   BP: 124/68   BP Source: Arm, Left Upper   Patient Position: Sitting   Pulse: 74   SpO2: 98%   Weight: 128.8 kg (284 lb)   Height: 1.715 m (5' 7.5)   PainSc: Zero     Body mass index is 43.82 kg/m?Marland Kitchen     Past Medical History  Patient Active Problem List    Diagnosis Date Noted   ? Headache 02/20/2019   ? Lightheadedness 02/20/2019   ? Atypical angina (HCC) 10/20/2015   ? Venous insufficiency 04/08/2013   ? Chest pain, rule out acute myocardial infarction 12/31/2012   ? 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 CAD (coronary artery disease) 10/03/2010     05/02/09 Heart Cath Single vessel disease:  LAD(mid) 85% discrete lesion treated with 3.5x73mm Medtronic endeavor sprint DES stent.  Presented with intermittent chest pain and abnormal stress test.  07/2010  Abnormal stress thallium  (antero-apical ischemia)  09/04/10 Heart Cath:  Non-obstructive Coronary disease, Mild ISR LAD(mid) tubular 20% lesion, LV EF 65%  12/2012 Cath:  LAD stent patent.  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  Mild disease of the left circumflex artery and RCA.     ? Hypertension 10/03/2010   ? Hyperlipidemia 10/03/2010   ? Diabetes mellitus, type 2 (HCC) 10/03/2010   ? Depression 10/03/2010   ? Sleep apnea 10/03/2010     Sleep study 09/13/09 Recommendations CPAP with 14cm           Review of Systems   Constitution: Negative.   HENT: Negative.    Eyes: Negative.    Cardiovascular: Positive for claudication and leg swelling.   Respiratory: Negative.    Endocrine: Negative.    Hematologic/Lymphatic: Negative.    Skin: Negative.    Musculoskeletal: Negative.    Gastrointestinal: Negative.    Genitourinary: Negative.    Neurological: Negative.    Psychiatric/Behavioral: Negative.    Allergic/Immunologic: Negative.        Physical Exam    Physical Exam   General Appearance: no distress   Skin: warm, no ulcers or xanthomas   Digits and Nails: no cyanosis or clubbing   Eyes: conjunctivae and lids normal, pupils are equal and round   Teeth/Gums/Palate: dentition unremarkable, no lesions   Lips & Oral Mucosa: no pallor or cyanosis  Neck Veins: normal JVP , neck veins are not distended   Thyroid: no nodules, masses, tenderness or enlargement   Chest Inspection: chest is normal in appearance   Respiratory Effort: breathing comfortably, no respiratory distress   Auscultation/Percussion: lungs clear to auscultation, no rales or rhonchi, no wheezing   PMI: PMI not enlarged or displaced   Cardiac Rhythm: regular rhythm and normal rate   Cardiac Auscultation: S1, S2 normal, no rub, no gallop   Murmurs: no murmur   Peripheral Circulation: normal peripheral circulation   Carotid Arteries: normal carotid upstroke bilaterally, no bruits   Radial Arteries: normal symmetric radial pulses   Abdominal Aorta: no abdominal aortic bruit   Pedal Pulses: normal symmetric pedal pulses   Lower Extremity Edema: trace bilateral lower extremity edema Abdominal Exam: soft, non-tender, no masses, bowel sounds normal   Liver & Spleen: no organomegaly   Gait & Station: walks without assistance   Muscle Strength: normal muscle tone   Orientation: oriented to time, place and person   Affect & Mood: appropriate and sustained affect   Language and Memory: patient responsive and seems to comprehend information   Neurologic Exam: neurological assessment grossly intact   Other: moves all extremities      Problems Addressed Today  Encounter Diagnoses   Name Primary?   ? Pure hypercholesterolemia Yes   ? Coronary artery disease due to lipid rich plaque    ? Essential hypertension    ? Venous insufficiency        Assessment and Plan       CAD (coronary artery disease)  Her LAD stent procedure was a year ago so she can discontinue Brilinta at this point.  She does need to continue aspirin 81 mg/day.  She had pretty reliable symptoms when she presented a year ago and I do not think we need to pursue surveillance stress testing as long as she is asymptomatic.    Hypertension  Blood pressure looks fine today.  She does not check it at home but whenever she gets it checked it is definitely averaging lower than 130/80.    Hyperlipidemia  She is due for a lipid profile.  I gave her an order today.  The goal for her LDL is less than 70.    Venous insufficiency  She will increase her furosemide dose to 40 mg/day for a few days.  She will look into whether or not she can find some compression stockings that fit.      Current Medications (including today's revisions)  ? acetaminophen (TYLENOL) 500 mg tablet Take two tablets by mouth every 6-8 hours as needed.   ? amitriptyline (ELAVIL) 25 mg tablet Take 25 mg by mouth at bedtime as needed.   ? aspirin EC 81 mg tablet Take 1 Tab by mouth daily. Take with food.   ? atorvastatin (LIPITOR) 80 mg tablet TAKE 1 TABLET BY MOUTH EVERY DAY   ? diclofenac (VOLTAREN) 1 % topical gel Apply 4 g topically to affected area four times daily as needed. ? dimenhyDRINATE(+) 50 mg tab Take 50 mg by mouth every 6 hours as needed.   ? diphenhydrAMINE (BENADRYL) 25 mg PO capsule Take 50 mg by mouth every 6 hours as needed.   ? EPINEPHrine (EPIPEN) 1 mg/mL injection pen (2-Pack) Inject 0.3 mg into the muscle once as needed. Inject 0.3 mg (1 Pen) into thigh if needed for anaphylactic reaction. May repeat in 5-15 minutes if needed.   ? ezetimibe (ZETIA) 10 mg  tablet TAKE 1 TABLET BY MOUTH EVERY DAY   ? furosemide (LASIX) 20 mg tablet Take 20 mg by mouth as Needed.   ? glimepiride (AMARYL) 4 mg tablet Take 1 tablet by mouth twice daily.   ? HYDROcodone/acetaminophen (NORCO) 5/325 mg tablet    ? ibuprofen (ADVIL) 200 mg tablet Take 600 mg by mouth every 6 hours as needed for Pain. Take with food.    ? insulin detemir U-100(+) (LEVEMIR) 100 unit/mL vial Inject thirty two Units under the skin twice daily. (Patient taking differently: Inject 35 Units under the skin twice daily.)   ? JARDIANCE 25 mg tablet Take 25 mg by mouth daily.   ? metFORMIN (GLUCOPHAGE) 1,000 mg tablet Take one tablet by mouth twice daily with meals. Ok to resume on February 15, 2019   ? nitroglycerin (NITROSTAT) 0.4 mg tablet Place one tablet under tongue every 5 minutes as needed for Chest Pain.   ? omeprazole DR(+) (PRILOSEC) 20 mg capsule Take 20 mg by mouth twice daily.   ? ranolazine ER (RANEXA) 500 mg tablet Take one tablet by mouth twice daily.   ? vitamins, multi w/minerals 9 mg iron-400 mcg tab Take 2 tablets by mouth daily. At home patient takes chewable gummy for adult women     Total time spent on today's office visit was 30 minutes.  This includes face-to-face in person visit with patient as well as nonface-to-face time including review of the EMR, outside records, labs, radiologic studies, echocardiogram & other cardiovascular studies, formation of treatment plan, after visit summary, future disposition, and lastly on documentation.

## 2020-02-03 NOTE — Assessment & Plan Note
She is due for a lipid profile.  I gave her an order today.  The goal for her LDL is less than 70.

## 2020-02-03 NOTE — Assessment & Plan Note
Her LAD stent procedure was a year ago so she can discontinue Brilinta at this point.  She does need to continue aspirin 81 mg/day.  She had pretty reliable symptoms when she presented a year ago and I do not think we need to pursue surveillance stress testing as long as she is asymptomatic.

## 2020-02-03 NOTE — Assessment & Plan Note
Blood pressure looks fine today.  She does not check it at home but whenever she gets it checked it is definitely averaging lower than 130/80.

## 2020-04-08 ENCOUNTER — Encounter: Admit: 2020-04-08 | Discharge: 2020-04-08 | Payer: Commercial Managed Care - PPO

## 2020-04-08 MED ORDER — RANOLAZINE 500 MG PO TB12
ORAL_TABLET | Freq: Two times a day (BID) | 3 refills | Status: AC
Start: 2020-04-08 — End: ?

## 2020-04-09 ENCOUNTER — Encounter: Admit: 2020-04-09 | Discharge: 2020-04-09 | Payer: Commercial Managed Care - PPO

## 2020-04-10 ENCOUNTER — Encounter: Admit: 2020-04-10 | Discharge: 2020-04-10 | Payer: Commercial Managed Care - PPO

## 2020-05-26 ENCOUNTER — Encounter: Admit: 2020-05-26 | Discharge: 2020-05-26 | Payer: Commercial Managed Care - PPO

## 2020-05-26 MED ORDER — EZETIMIBE 10 MG PO TAB
ORAL_TABLET | Freq: Every day | 11 refills | Status: AC
Start: 2020-05-26 — End: ?

## 2020-06-09 ENCOUNTER — Encounter: Admit: 2020-06-09 | Discharge: 2020-06-09 | Payer: Commercial Managed Care - PPO

## 2020-06-09 DIAGNOSIS — I208 Other forms of angina pectoris: Secondary | ICD-10-CM

## 2020-06-09 DIAGNOSIS — E785 Hyperlipidemia, unspecified: Secondary | ICD-10-CM

## 2020-06-09 DIAGNOSIS — I2510 Atherosclerotic heart disease of native coronary artery without angina pectoris: Secondary | ICD-10-CM

## 2020-06-09 DIAGNOSIS — I10 Essential (primary) hypertension: Secondary | ICD-10-CM

## 2020-06-09 MED ORDER — ATORVASTATIN 80 MG PO TAB
ORAL_TABLET | Freq: Every day | 3 refills | Status: AC
Start: 2020-06-09 — End: ?

## 2020-07-17 ENCOUNTER — Encounter: Admit: 2020-07-17 | Discharge: 2020-07-17 | Payer: Commercial Managed Care - PPO

## 2020-07-17 NOTE — Telephone Encounter
Patient called reporting active chest pain. She denies other symptoms, but the chest pain seems to be increasing since onset. She had a fall out of bed Thursday and has noticed new onset of chest since the fall. I advised pt since she has a history of CAD she should be evaluated in the ER.  Pt works at [employer] and agrees to go to [employer].

## 2020-08-29 ENCOUNTER — Encounter: Admit: 2020-08-29 | Discharge: 2020-08-29 | Payer: Commercial Managed Care - PPO

## 2020-08-29 MED ORDER — EZETIMIBE 10 MG PO TAB
ORAL_TABLET | Freq: Every day | 3 refills | Status: AC
Start: 2020-08-29 — End: ?

## 2020-09-07 ENCOUNTER — Encounter: Admit: 2020-09-07 | Discharge: 2020-09-07 | Payer: Commercial Managed Care - PPO

## 2020-09-07 DIAGNOSIS — R42 Dizziness and giddiness: Secondary | ICD-10-CM

## 2020-09-07 DIAGNOSIS — I208 Other forms of angina pectoris: Secondary | ICD-10-CM

## 2020-09-07 DIAGNOSIS — I10 Essential (primary) hypertension: Secondary | ICD-10-CM

## 2020-09-07 DIAGNOSIS — E7800 Pure hypercholesterolemia, unspecified: Secondary | ICD-10-CM

## 2020-09-07 DIAGNOSIS — R079 Chest pain, unspecified: Secondary | ICD-10-CM

## 2020-09-07 DIAGNOSIS — E785 Hyperlipidemia, unspecified: Secondary | ICD-10-CM

## 2020-09-07 DIAGNOSIS — F329 Major depressive disorder, single episode, unspecified: Secondary | ICD-10-CM

## 2020-09-07 DIAGNOSIS — I2510 Atherosclerotic heart disease of native coronary artery without angina pectoris: Secondary | ICD-10-CM

## 2020-09-07 DIAGNOSIS — Z2821 Immunization not carried out because of patient refusal: Secondary | ICD-10-CM

## 2020-09-07 LAB — LIPID PROFILE
Lab: 4
Lab: 151 mL/min (ref >60)
Lab: 220 mL/min — ABNORMAL HIGH (ref <150)
Lab: 36 K/UL — ABNORMAL LOW (ref >=40)
Lab: 44 — ABNORMAL HIGH (ref 5–40)
Lab: 71 K/UL (ref 0–0.20)

## 2020-09-07 NOTE — Assessment & Plan Note
Lab Results   Component Value Date    CHOL 151 09/07/2020    TRIG 220 (H) 09/07/2020    HDL 36 (L) 09/07/2020    LDL 71 09/07/2020    VLDL 44 (H) 09/07/2020    NONHDLCHOL 90 02/12/2019    CHOLHDLC 4 09/07/2020      LDL treated to goal.

## 2020-09-07 NOTE — Assessment & Plan Note
She has had recurring episodes of syncope and near syncope, some of which have resulted in falls with injury.  Her symptoms suggest cardiac dysrhythmia and I have arranged for a 30-day event monitor.

## 2020-09-07 NOTE — Assessment & Plan Note
Blood pressure appears to be treated to goal.

## 2020-09-07 NOTE — Assessment & Plan Note
She has declined vaccination due to her multiple allergies

## 2020-09-07 NOTE — Progress Notes
Date of Service: 09/07/2020    Nicole Pace is a 64 y.o. female.       HPI     Nicole Pace was in the [facility] clinic today for evaluation of recurrent syncope and near syncope.      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    She saw a neurologist recently for episodes of recurrent syncope and near syncope and it sounds as if she is going to be scheduled for an EEG.    Although she does not have documented Covid infection her husband did test positive and was quite ill back in November.    She thinks that her episodes of syncope and near syncope predate her husband's illness.  She has had a couple of falls due to syncopal episodes and with 1 of these falls she fractured her wrist.  She denies a prior history of any sort of autonomic dysfunction and there really was no sort of vagal prodrome prior to the episodes.  There was no preceding episodes of chest discomfort, palpitation, or breathlessness.       Vitals:    09/07/20 1016   BP: 120/74   BP Source: Arm, Left Upper   Patient Position: Sitting   Pulse: 63   SpO2: 98%   Weight: 129.4 kg (285 lb 3.2 oz)   Height: 1.715 m (5' 7.5)   PainSc: Zero     Body mass index is 44.01 kg/m?Marland Kitchen     Past Medical History  Patient Active Problem List    Diagnosis Date Noted   ? 2019 novel coronavirus vaccination declined 09/07/2020   ? Right middle trigger finger 04/10/2020   ? Headache 02/20/2019   ? Lightheadedness 02/20/2019   ? Atypical angina (HCC) 10/20/2015   ? Venous insufficiency 04/08/2013   ? Chest pain, rule out acute myocardial infarction 12/31/2012   ? 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 CAD (coronary artery disease) 10/03/2010     05/02/09 Heart Cath Single vessel disease:  LAD(mid) 85% discrete lesion treated with 3.5x17mm Medtronic endeavor sprint DES stent.  Presented with intermittent chest pain and abnormal stress test.  07/2010  Abnormal stress thallium  (antero-apical ischemia)  09/04/10 Heart Cath:  Non-obstructive Coronary disease, Mild ISR LAD(mid) tubular 20% lesion, LV EF 65%  12/2012 Cath:  LAD stent patent.  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  Mild disease of the left circumflex artery and RCA.     ? Hypertension 10/03/2010   ? Hyperlipidemia 10/03/2010   ? Diabetes mellitus, type 2 (HCC) 10/03/2010   ? Depression 10/03/2010   ? Sleep apnea 10/03/2010     Sleep study 09/13/09 Recommendations CPAP with 14cm           Review of Systems   Constitutional: Negative.   HENT: Negative.    Eyes: Negative.    Cardiovascular: Negative.    Respiratory: Negative.    Endocrine: Negative.    Hematologic/Lymphatic: Negative.    Skin: Negative.    Musculoskeletal: Negative.    Gastrointestinal: Negative.    Genitourinary: Negative.    Neurological: Positive for dizziness, headaches, light-headedness and loss of balance.   Psychiatric/Behavioral: Negative.    Allergic/Immunologic: Negative.        Physical Exam  Physical Exam   General Appearance: no distress   Skin: warm, no ulcers or xanthomas   Digits and Nails: no cyanosis or clubbing   Eyes: conjunctivae and lids normal, pupils are equal and round   Teeth/Gums/Palate: dentition unremarkable, no lesions   Lips & Oral Mucosa: no pallor or cyanosis   Neck Veins: normal JVP , neck veins are not distended   Thyroid: no nodules, masses, tenderness or enlargement   Chest Inspection: chest is normal in appearance   Respiratory Effort: breathing comfortably, no respiratory distress   Auscultation/Percussion: lungs clear to auscultation, no rales or rhonchi, no wheezing   PMI: PMI not enlarged or displaced   Cardiac Rhythm: regular rhythm and normal rate   Cardiac Auscultation: S1, S2 normal, no rub, no gallop Murmurs: no murmur   Peripheral Circulation: normal peripheral circulation   Carotid Arteries: normal carotid upstroke bilaterally, no bruits   Radial Arteries: normal symmetric radial pulses   Abdominal Aorta: no abdominal aortic bruit   Pedal Pulses: normal symmetric pedal pulses   Lower Extremity Edema: no lower extremity edema   Abdominal Exam: soft, non-tender, no masses, bowel sounds normal   Liver & Spleen: no organomegaly   Gait & Station: walks without assistance   Muscle Strength: normal muscle tone   Orientation: oriented to time, place and person   Affect & Mood: appropriate and sustained affect   Language and Memory: patient responsive and seems to comprehend information   Neurologic Exam: neurological assessment grossly intact   Other: moves all extremities      Cardiovascular Studies    EKG:  Sinus rhythm, rate 76.  Normal EKG  (done 8/26)    Problems Addressed Today  Encounter Diagnoses   Name Primary?   ? Coronary artery disease due to lipid rich plaque Yes   ? Essential hypertension    ? Pure hypercholesterolemia    ? Atypical angina (HCC)    ? Chest pain, rule out acute myocardial infarction    ? 2019 novel coronavirus vaccination declined    ? Lightheadedness        Assessment and Plan       2019 novel coronavirus vaccination declined  She has declined vaccination due to her multiple allergies    CAD (coronary artery disease)  She had fairly typical angina symptoms previously and has had no recurrence after her LAD stent in February, 2020.    Hyperlipidemia  Lab Results   Component Value Date    CHOL 151 09/07/2020    TRIG 220 (H) 09/07/2020    HDL 36 (L) 09/07/2020    LDL 71 09/07/2020    VLDL 44 (H) 09/07/2020    NONHDLCHOL 90 02/12/2019    CHOLHDLC 4 09/07/2020      LDL treated to goal.    Hypertension  Blood pressure appears to be treated to goal.    Lightheadedness  She has had recurring episodes of syncope and near syncope, some of which have resulted in falls with injury.  Her symptoms suggest cardiac dysrhythmia and I have arranged for a 30-day event monitor.      Current Medications (including today's revisions)  ? acetaminophen (TYLENOL) 500 mg tablet Take two tablets by mouth every 6-8 hours as needed.   ? albuterol sulfate (PROAIR HFA) 90 mcg/actuation HFA aerosol inhaler    ? amitriptyline (ELAVIL) 25 mg tablet Take 25 mg by mouth at bedtime as needed.   ? aspirin EC 81 mg tablet Take 1 Tab by mouth  daily. Take with food.   ? atorvastatin (LIPITOR) 80 mg tablet TAKE 1 TABLET BY MOUTH EVERY DAY   ? diclofenac (VOLTAREN) 1 % topical gel Apply 4 g topically to affected area four times daily as needed.   ? dimenhyDRINATE(+) 50 mg tab Take 50 mg by mouth every 6 hours as needed.   ? diphenhydrAMINE (BENADRYL) 25 mg PO capsule Take 50 mg by mouth every 6 hours as needed.   ? duloxetine DR (CYMBALTA) 20 mg capsule    ? EPINEPHrine (EPIPEN) 1 mg/mL injection pen (2-Pack) Inject 0.3 mg into the muscle once as needed. Inject 0.3 mg (1 Pen) into thigh if needed for anaphylactic reaction. May repeat in 5-15 minutes if needed.   ? ezetimibe (ZETIA) 10 mg tablet TAKE ONE TABLET BY MOUTH EVERY DAY   ? FEROSUL 325 mg (65 mg iron) tablet    ? furosemide (LASIX) 20 mg tablet Take 20 mg by mouth daily.   ? glimepiride (AMARYL) 4 mg tablet Take 1 tablet by mouth twice daily.   ? HYDROcodone/acetaminophen (NORCO) 5/325 mg tablet    ? ibuprofen (ADVIL) 200 mg tablet Take 600 mg by mouth every 6 hours as needed for Pain. Take with food.    ? insulin detemir U-100(+) (LEVEMIR) 100 unit/mL vial Inject thirty two Units under the skin twice daily. (Patient taking differently: Inject 35 Units under the skin twice daily.)   ? JARDIANCE 25 mg tablet Take 25 mg by mouth daily.   ? metFORMIN (GLUCOPHAGE) 1,000 mg tablet Take one tablet by mouth twice daily with meals. Ok to resume on February 15, 2019   ? nitroglycerin (NITROSTAT) 0.4 mg tablet Place one tablet under tongue every 5 minutes as needed for Chest Pain.   ? omeprazole DR(+) (PRILOSEC) 20 mg capsule Take 20 mg by mouth twice daily.   ? pregabalin (LYRICA) 300 mg capsule Take 300 mg by mouth twice daily.   ? ranolazine ER (RANEXA) 500 mg tablet TAKE 1 TABLET BY MOUTH TWICE DAILY   ? vitamins, multi w/minerals 9 mg iron-400 mcg tab Take 2 tablets by mouth daily. At home patient takes chewable gummy for adult women     Total time spent on today's office visit was 45 minutes.  This includes face-to-face in person visit with patient as well as nonface-to-face time including review of the EMR, outside records, labs, radiologic studies, echocardiogram & other cardiovascular studies, formation of treatment plan, after visit summary, future disposition, and lastly on documentation.

## 2020-09-07 NOTE — Assessment & Plan Note
She had fairly typical angina symptoms previously and has had no recurrence after her LAD stent in February, 2020.

## 2020-09-26 ENCOUNTER — Encounter: Admit: 2020-09-26 | Discharge: 2020-09-26 | Payer: Commercial Managed Care - PPO

## 2020-09-26 NOTE — Progress Notes
Ordering Physician:Owens  Type of Monitor:Looping  Length of Study:30  ZO:[id number]

## 2020-10-09 ENCOUNTER — Encounter: Admit: 2020-10-09 | Discharge: 2020-10-09 | Payer: Commercial Managed Care - PPO

## 2020-10-09 NOTE — Telephone Encounter
Nicole Sarah, MD  Lauralee Evener, RN    The monitor can come off. ?She'll be monitored for the procedure, no problem.

## 2020-10-09 NOTE — Telephone Encounter
Notified patient the monitor can come off during her procedure. Patient does not have any further questions at this time.

## 2020-10-09 NOTE — Telephone Encounter
Patient called with questions about 30 day heart monitor. Heart monitor started on 10/08/20. Patient states she would like to have a trigger finger fixed on 10/26/20. She says it will require a nerve block not general anesthesia. She would like to know if she can have it done with the monitor on and if she has clearance to have the procedure.    Will route to Dr. Barry Dienes for his review and recommendations.

## 2020-10-11 ENCOUNTER — Encounter: Admit: 2020-10-11 | Discharge: 2020-10-11 | Payer: Commercial Managed Care - PPO

## 2020-10-11 NOTE — Telephone Encounter
-----   Message from Vanice Sarah, MD sent at 10/11/2020  8:09 AM CDT -----  Regarding: RE: Cardiac clearance  Cleared, no additional testing.  OK to hold aspirin for 7 days prior.  ----- Message -----  From: Nicole Pace  Sent: 10/09/2020   3:41 PM CDT  To: Vanice Sarah, MD  Subject: Cardiac clearance                                Dr. Aundria Rud faxed request wants surgical clearance for her middle finger trigger release scheduled 11/4.  Also requesting guidance of ASA.      Let me know if any testing or ov required and asa recommendations.

## 2020-11-30 ENCOUNTER — Encounter: Admit: 2020-11-30 | Discharge: 2020-11-30 | Payer: Commercial Managed Care - PPO

## 2020-11-30 NOTE — Telephone Encounter
Called and discussed results with patient.  No questions at this time.  Pt will callback with any questions, concerns or problems.

## 2020-11-30 NOTE — Telephone Encounter
-----   Message from Vanice Sarah, MD sent at 11/30/2020  7:31 AM CST -----  [facility] nursing, can you please let Kinnley know that her monitor looked fine?  No arrhythmia associated with symptoms.    Cc:  Dr. Tonna Corner

## 2020-12-18 ENCOUNTER — Encounter: Admit: 2020-12-18 | Discharge: 2020-12-18 | Payer: Commercial Managed Care - PPO

## 2020-12-18 MED ORDER — EZETIMIBE 10 MG PO TAB
ORAL_TABLET | Freq: Every day | 3 refills | Status: AC
Start: 2020-12-18 — End: ?

## 2021-04-10 ENCOUNTER — Encounter: Admit: 2021-04-10 | Discharge: 2021-04-10 | Payer: Commercial Managed Care - PPO

## 2021-04-19 ENCOUNTER — Encounter: Admit: 2021-04-19 | Discharge: 2021-04-19 | Payer: Commercial Managed Care - PPO

## 2021-04-25 ENCOUNTER — Encounter: Admit: 2021-04-25 | Discharge: 2021-04-25 | Payer: Commercial Managed Care - PPO

## 2021-04-27 ENCOUNTER — Encounter: Admit: 2021-04-27 | Discharge: 2021-04-27 | Payer: Commercial Managed Care - PPO

## 2021-04-27 MED ORDER — RANOLAZINE 500 MG PO TB12
ORAL_TABLET | Freq: Two times a day (BID) | 3 refills | Status: AC
Start: 2021-04-27 — End: ?

## 2021-05-02 ENCOUNTER — Encounter: Admit: 2021-05-02 | Discharge: 2021-05-02 | Payer: Commercial Managed Care - PPO

## 2021-05-17 ENCOUNTER — Encounter: Admit: 2021-05-17 | Discharge: 2021-05-17 | Payer: Commercial Managed Care - PPO

## 2021-05-17 DIAGNOSIS — I2510 Atherosclerotic heart disease of native coronary artery without angina pectoris: Secondary | ICD-10-CM

## 2021-05-17 DIAGNOSIS — E785 Hyperlipidemia, unspecified: Secondary | ICD-10-CM

## 2021-05-17 DIAGNOSIS — I208 Other forms of angina pectoris: Secondary | ICD-10-CM

## 2021-05-17 DIAGNOSIS — I10 Essential (primary) hypertension: Secondary | ICD-10-CM

## 2021-05-17 MED ORDER — ATORVASTATIN 80 MG PO TAB
ORAL_TABLET | Freq: Every day | 3 refills | Status: AC
Start: 2021-05-17 — End: ?

## 2021-12-27 ENCOUNTER — Encounter: Admit: 2021-12-27 | Discharge: 2021-12-27 | Payer: Commercial Managed Care - PPO

## 2021-12-27 MED ORDER — EZETIMIBE 10 MG PO TAB
10 mg | ORAL_TABLET | Freq: Every day | ORAL | 0 refills | Status: AC
Start: 2021-12-27 — End: ?

## 2022-06-04 ENCOUNTER — Encounter: Admit: 2022-06-04 | Discharge: 2022-06-04 | Payer: Commercial Managed Care - PPO

## 2022-06-04 MED ORDER — EZETIMIBE 10 MG PO TAB
ORAL_TABLET | 0 refills
Start: 2022-06-04 — End: ?

## 2022-06-05 ENCOUNTER — Encounter: Admit: 2022-06-05 | Discharge: 2022-06-05 | Payer: Commercial Managed Care - PPO

## 2022-06-05 DIAGNOSIS — E785 Hyperlipidemia, unspecified: Secondary | ICD-10-CM

## 2022-06-05 DIAGNOSIS — I2510 Atherosclerotic heart disease of native coronary artery without angina pectoris: Secondary | ICD-10-CM

## 2022-06-05 DIAGNOSIS — I10 Essential (primary) hypertension: Secondary | ICD-10-CM

## 2022-06-05 DIAGNOSIS — I208 Other forms of angina pectoris: Secondary | ICD-10-CM

## 2022-06-05 MED ORDER — ATORVASTATIN 80 MG PO TAB
80 mg | ORAL_TABLET | Freq: Every day | ORAL | 3 refills | Status: AC
Start: 2022-06-05 — End: ?

## 2022-06-20 ENCOUNTER — Encounter: Admit: 2022-06-20 | Discharge: 2022-06-20 | Payer: Commercial Managed Care - PPO

## 2022-10-18 ENCOUNTER — Encounter: Admit: 2022-10-18 | Discharge: 2022-10-18 | Payer: Commercial Managed Care - PPO

## 2022-10-21 ENCOUNTER — Encounter: Admit: 2022-10-21 | Discharge: 2022-10-21 | Payer: Commercial Managed Care - PPO

## 2022-10-21 NOTE — Progress Notes
Records Request- STAT    Medical records request for continuation of care:    Patient has appointment TOMORROW   with  Dr. Ricard Dillon    Please fax records to [facility]    Request records:    Nicole Pace  1956-09-05    Sleep Study    Thank you,      [facility]  [address] [place] [address]  Phone:  [phone]  Fax:  [phone]

## 2022-10-22 ENCOUNTER — Encounter: Admit: 2022-10-22 | Discharge: 2022-10-22 | Payer: MEDICARE

## 2022-10-22 ENCOUNTER — Ambulatory Visit: Admit: 2022-10-22 | Discharge: 2022-10-23 | Payer: MEDICARE

## 2022-10-22 DIAGNOSIS — I2510 Atherosclerotic heart disease of native coronary artery without angina pectoris: Secondary | ICD-10-CM

## 2022-10-22 DIAGNOSIS — I200 Unstable angina: Secondary | ICD-10-CM

## 2022-10-22 DIAGNOSIS — E7800 Pure hypercholesterolemia, unspecified: Secondary | ICD-10-CM

## 2022-10-22 DIAGNOSIS — I10 Essential (primary) hypertension: Secondary | ICD-10-CM

## 2022-10-22 DIAGNOSIS — E785 Hyperlipidemia, unspecified: Secondary | ICD-10-CM

## 2022-10-22 DIAGNOSIS — F32A Depression: Secondary | ICD-10-CM

## 2022-10-22 MED ORDER — ASPIRIN 325 MG PO TAB
325 mg | Freq: Once | ORAL | 0 refills
Start: 2022-10-22 — End: ?

## 2022-10-22 MED ORDER — NITROGLYCERIN 0.4 MG SL SUBL
.4 mg | ORAL_TABLET | SUBLINGUAL | 3 refills | 9.00000 days | Status: AC | PRN
Start: 2022-10-22 — End: ?

## 2022-10-22 NOTE — Patient Instructions
CARDIAC CATHETERIZATION   PRE-ADMISSION INSTRUCTIONS    Patient Name: Nicole Pace  MRN#: [id number]  Date of Birth: 1956-05-07 (66 y.o.)  Today's Date: 10/22/2022    PROCEDURE:  You are scheduled for a Coronary Angiogram with possible Angioplasty/Stenting with Dr. Greig Castilla.    PROCEDURE DATE AND ARRIVAL TIME:  Your procedure date is 11/04/22.  You will receive a call from the Cath lab staff between 8:00 a.m. and noon on the business day prior to your procedure to let you know at what time to arrive on the day of your procedure.    Please check in at the Admitting Desk in the [facility] for your procedure.   Address:  [address], [place], [place] [address]    [facility] Entrance and and take a right. Continue down the hallway past the Cardiovascular Medicine office. That hall will take you into the [facility]. Check in at the desk on the left side.)     (If you have further questions regarding your arrival time for the CV lab, please call [phone] by 3:00pm the day before your procedure. Please leave a message with your name and number, your call will be returned in a timely manner.)    PRE-PROCEDURE APPOINTMENTS:                   Pre-Admission lab work required within 14 days of procedure: BMP and CBC at the lab of your choice.         FOOD AND DRINK INSTRUCTIONS  Nothing to eat after midnight before your procedure. No caffeine for 24 hours prior to your procedure. You will be under moderate sedation for your procedure.  You may drink clear liquids up to an hour before hospital arrival. This will be confirmed by the Cath lab staff the day before your procedure.     SPECIAL MEDICATION INSTRUCTIONS  Any new prescriptions will be sent to your pharmacy listed on file with [place].        Please either take 4 baby aspirins (4 times 81mg ) or one full strength NON-COATED 325mg  aspirin. Take the day before and the morning of the procedure  Insulin: Hold insulin the morning of the procedure  Hypoglycemics: glimepiride (Amaryl) -- hold the morning of your procedure.    Hold all oral diabetic medication the morning of the procedure    TAKE AM OF PROCEDURE:    HOLD ALL erectile dysfunction medications for 3 days, unless prescribed for pulmonary hypertension.  HOLD ALL over the counter vitamins or supplements on the morning of your procedure.      Additional Instructions  If you wear CPAP, please bring your mask and machine with you to the hospital.    Take a bath or shower with anti-bacterial soap the evening before, or the morning of the procedure.     Bring photo ID and your health insurance card(s).    Arrange for a driver to take you home from the hospital. Please arrange for a friend or family member to take you home from this test. You cannot take a Taxi, [employer] Goad, or public transportation as there has to be a responsible person to help care for you after sedation    Bring an accurate list of your current medications with you to the hospital (all medications and supplements taken daily).  Please use the medication list below and write in the date and time when you took your last dose before your procedure. Update this  list of medications as needed.      Wear comfortable clothes and don't bring valuables, other than photo identification card, with you to the hospital.    Please pack a bag for an overnight stay.     Please review your pre-procedure instructions and bring them with you on the day of your procedure.  Call the office at [phone] with any questions. You may ask to speak with Dr. Reola Mosher nurse.        ALLERGIES  Allergies   Allergen Reactions    Imdur [Isosorbide Mononitrate] CHEST TIGHTNESS and HEADACHE    Nuts ANAPHYLAXIS     Tree nuts    Other [Unclassified Drug] ANAPHYLAXIS     Horseradish    Artificial Sweetner NAUSEA AND VOMITING    Flu Vaccine [Influenza Virus Vaccines] NAUSEA AND VOMITING and EDEMA     Arm swelling    Morphine HIVES    Tessalon [Benzonatate] ANAPHYLAXIS    Effient [Prasugrel] HEADACHE and ITCHING       CURRENT MEDICATIONS  Outpatient Encounter Medications as of 10/22/2022   Medication Sig Dispense Refill    acetaminophen (TYLENOL) 500 mg tablet Take two tablets by mouth every 6-8 hours as needed.      albuterol sulfate (PROAIR HFA) 90 mcg/actuation HFA aerosol inhaler       amitriptyline (ELAVIL) 25 mg tablet Take one tablet by mouth at bedtime as needed.      aspirin EC 81 mg tablet Take 1 Tab by mouth daily. Take with food. 90 Tab 3    atorvastatin (LIPITOR) 80 mg tablet Take one tablet by mouth daily. 90 tablet 3    CHOLEcalciferoL (vitamin D3) 50 mcg (2,000 unit) capsule Take one capsule by mouth daily.      diclofenac (VOLTAREN) 1 % topical gel Apply four g topically to affected area four times daily as needed.      dimenhyDRINATE(+) 50 mg tab Take one tablet by mouth every 6 hours as needed.      diphenhydrAMINE (BENADRYL) 25 mg PO capsule Take two capsules by mouth every 6 hours as needed.      duloxetine DR (CYMBALTA) 30 mg capsule Take one capsule by mouth twice daily.      EPINEPHrine (EPIPEN) 1 mg/mL injection pen (2-Pack) Inject 0.3 mL into the muscle once as needed. Inject 0.3 mg (1 Pen) into thigh if needed for anaphylactic reaction. May repeat in 5-15 minutes if needed.      ezetimibe (ZETIA) 10 mg tablet TAKE 1 TABLET BY MOUTH EVERY DAY (NEED NEW INS CARD) 90 tablet 0    glimepiride (AMARYL) 4 mg tablet Take one tablet by mouth twice daily.      hydrOXYzine HCL (ATARAX) 25 mg tablet Take one tablet by mouth daily.      ibuprofen (ADVIL) 200 mg tablet Take three tablets by mouth every 6 hours as needed for Pain. Take with food.      insulin detemir U-100(+) (LEVEMIR) 100 unit/mL vial Inject thirty two Units under the skin twice daily. (Patient taking differently: Inject fifty Units under the skin twice daily.)  0    nitroglycerin (NITROSTAT) 0.4 mg tablet Place one tablet under tongue every 5 minutes as needed for Chest Pain. 25 tablet 3 other medication one Dose. GOLO Weight Loss Supplements      other medication one Dose. Nervive Nerve Relief       No facility-administered encounter medications on file as of 10/22/2022.  _________________________________________  Form completed by: Weston Brass  Date completed: 10/22/22  Method: In person and given to the patient.        Coronary Angiography  Angiography is a special type of moving X-ray that lets your doctor view your coronary arteries to see if the blood vessels to your heart are narrowed or blocked. This test is done when someone is having a heart attack. Or it may be done if symptoms may mean a heart attack. It also may be done after an abnormal cardiac stress test.    Before the procedure   Tell your healthcare team what medicines you take and any allergies you may have.  Tell your healthcare team if you've had a reaction to contrast dye or have had any kidney problems.  Follow any directions you are given for not eating or drinking before surgery.  A nurse will place an IV (intravenous) catheter in your vein to give fluids, and medicine to relieve pain and help you feel less anxious.  They clean your skin and shave the area where the catheter will be inserted, if needed.    During the procedure   You will lie on a table with a portable X-ray machine over you. The team will place a surgical drape over your body. The area where the doctor chooses to insert the catheter will be cleaned. This will be either a wrist or the groin.  Your doctor will place a long, thin tube (catheter) inside an artery in your groin or arm and guide it into your heart. You may feel pressure with the insertion of the catheter. A numbing medicine often is injected at the insertion site. This eases discomfort during the procedure.  They will inject a contrast dye through the catheter into your blood vessels or heart chambers. You may feel a warm sensation or feeling like you have to urinate when the contrast is injected. This is normal.  X-rays are taken to show images of the inside of your heart and coronary arteries.     The catheter can be placed into the groin, arm, or wrist.     After the procedure   Your healthcare team will tell you how long to lie down and keep the insertion site still. The amount of time may depend on whether a closure device such as a stitch or collagen plug was used to close the opening made in your artery. The time you must be still may be shorter if one of these devices was used. The amount of time will also depend on if there is any bleeding at the catheter insertion site.  If the insertion site was in your groin, you may need to lie down with your leg still for several hours. If the insertion site was in your wrist, a pressure bandage may be put on the site. Or you may have closure device placed on the insertion site. It will be taken off when there is no sign of bleeding. If bleeding occurs, a nurse will put pressure on the area to control it.  A nurse will check your blood pressure and the insertion site often. This is to make sure you remain stable after the procedure.  You may be asked to drink fluid to help flush the contrast liquid out of your system.  Have someone drive you home from the hospital.  If your doctor uses angioplasty or a stent to treat a blocked artery, you may stay the night in the hospital.  If there are multiple blockages that can't be fixed with a stent or angioplasty, you may need surgery to bypass the blockages. This is called coronary artery bypass graft surgery. Your doctor will explain the results of your test and what treatment options that may be best for you.  It?s normal to find a small bruise or lump at the insertion site. The lump may be the collagen plug or stitch that you feel, or a small bruise. These common side effects should disappear within a few weeks.  You will be given instructions by your healthcare team on recovering from the coronary angiography. In general, don't lift anything heavier than a gallon of milk for several days. This gives time for the puncture site in the artery wall to heal. Try not to get the puncture site wet. Don't put it under water. Showers are OK. Don't soak in a bathtub, swimming pool, or hot tub until the skin has healed.    When to call your healthcare provider   Call your healthcare provider right away if you have any of these:   Symptoms of infection. These include pain, swelling, redness, bleeding, or drainage at the insertion site.  Fever of 100.4?F (38?C) or higher, or as advised by your provider  Bleeding, bruising, or a lot of swelling where the catheter was inserted  Blood in your urine  Black or tarry stools  Any unusual bleeding  Irregular, very slow, or fast heartbeat  Dizziness  Call 911  Call 911if any of these occur:     Chest pain  Shortness of breath  Sudden numbness or weakness in arms, legs, or face, or difficulty speaking  The puncture site swells up very fast  Bleeding from the puncture site that does not slow down with firm pressure  Severe or increasing pain, numbness, coldness, or a bluish color in the leg or arm that held the catheter    [employer] last reviewed this educational content on 01/23/2021  ? 2000-2023 [employer], [place]. All rights reserved. This information is not intended as a substitute for professional medical care. Always follow your healthcare professional's instructions.

## 2022-10-22 NOTE — Progress Notes
Date of Service: 10/22/2022    Nicole Pace is a 66 y.o. female.       HPI     Nicole Pace was in the [place] clinic today for a working visit regarding chest discomfort and exertional dyspnea.  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  She is happy to be in retirement, but about 6 weeks ago she had the relatively sudden onset of quite severe exertional dyspnea and some occasional chest discomfort.    She was seen by Dr. Tonna Corner in the clinic last week.  BP was OK and exam unremarkable.  Her EKG showed sinus rhythm with no ischemic changes.  A chest x-ray was unremarkable.  BNP was less than 10.  Her hemoglobin was O I do not think a troponin was checked.  She had just eaten so her glucose was high and her lipid profile was jacked up.    She has continued to have rather disabling exertional dyspnea along with some migratory chest pains since that visit last week.  She now says that her symptoms remind her of symptoms she had prior to coronary stent procedures in the past.         Vitals:    10/22/22 0958   BP: 122/72   BP Source: Arm, Left Upper   Pulse: 84   SpO2: 98%   O2 Device: None (Room air)   PainSc: Zero   Weight: 113.9 kg (251 lb)   Height: 171.5 cm (5' 7.5)     Body mass index is 38.73 kg/m?Marland Kitchen     Past Medical History  Patient Active Problem List    Diagnosis Date Noted   ? 2019 novel coronavirus vaccination declined 09/07/2020   ? Right middle trigger finger 04/10/2020   ? Headache 02/20/2019   ? Lightheadedness 02/20/2019   ? 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 Venous insufficiency 04/08/2013   ? Chest pain, rule out acute myocardial infarction 12/31/2012   ? 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 CAD (coronary artery disease) 10/03/2010     05/02/09 Heart Cath Single vessel disease:  LAD(mid) 85% discrete lesion treated with 3.5x63mm Medtronic endeavor sprint DES stent.  Presented with intermittent chest pain and abnormal stress test.  07/2010  Abnormal stress thallium  (antero-apical ischemia)  09/04/10 Heart Cath:  Non-obstructive Coronary disease, Mild ISR LAD(mid) tubular 20% lesion, LV EF 65%  12/2012 Cath:  LAD stent patent.  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  Mild disease of the left circumflex artery and RCA.     ? Hypertension 10/03/2010   ? Hyperlipidemia 10/03/2010   ? Diabetes mellitus, type 2 (HCC) 10/03/2010   ? Depression 10/03/2010   ? Sleep apnea 10/03/2010     Sleep study 09/13/09 Recommendations CPAP with 14cm           Review of Systems   Constitutional: Negative.   HENT: Negative.    Eyes: Negative.    Cardiovascular: Positive for chest pain and dyspnea on exertion.   Respiratory: Positive for shortness of breath.    Endocrine: Negative.    Hematologic/Lymphatic: Negative.    Skin: Negative.    Musculoskeletal: Negative.    Gastrointestinal: Negative.    Genitourinary: Negative.    Neurological: Positive for  light-headedness and loss of balance.   Psychiatric/Behavioral: Negative.    Allergic/Immunologic: Negative.        Physical Exam    Physical Exam   General Appearance: no distress   Skin: warm, no ulcers or xanthomas   Digits and Nails: no cyanosis or clubbing   Eyes: conjunctivae and lids normal, pupils are equal and round   Teeth/Gums/Palate: dentition unremarkable, no lesions   Lips & Oral Mucosa: no pallor or cyanosis   Neck Veins: normal JVP , neck veins are not distended   Thyroid: no nodules, masses, tenderness or enlargement   Chest Inspection: chest is normal in appearance   Respiratory Effort: breathing comfortably, no respiratory distress   Auscultation/Percussion: lungs clear to auscultation, no rales or rhonchi, no wheezing   PMI: PMI not enlarged or displaced   Cardiac Rhythm: regular rhythm and normal rate   Cardiac Auscultation: S1, S2 normal, no rub, no gallop   Murmurs: no murmur   Peripheral Circulation: normal peripheral circulation   Carotid Arteries: normal carotid upstroke bilaterally, no bruits   Radial Arteries: normal symmetric radial pulses   Abdominal Aorta: no abdominal aortic bruit   Pedal Pulses: normal symmetric pedal pulses   Lower Extremity Edema: no lower extremity edema   Abdominal Exam: soft, non-tender, no masses, bowel sounds normal   Liver & Spleen: no organomegaly   Gait & Station: walks without assistance   Muscle Strength: normal muscle tone   Orientation: oriented to time, place and person   Affect & Mood: appropriate and sustained affect   Language and Memory: patient responsive and seems to comprehend information   Neurologic Exam: neurological assessment grossly intact   Other: moves all extremities      Cardiovascular Health Factors  Vitals BP Readings from Last 3 Encounters:   10/22/22 122/72   09/07/20 120/74   02/03/20 124/68     Wt Readings from Last 3 Encounters:   10/22/22 113.9 kg (251 lb)   09/07/20 129.4 kg (285 lb 3.2 oz)   04/10/20 124.7 kg (275 lb)     BMI Readings from Last 3 Encounters:   10/22/22 38.73 kg/m?   09/07/20 44.01 kg/m?   04/10/20 42.44 kg/m?      Smoking Social History     Tobacco Use   Smoking Status Never   Smokeless Tobacco Never      Lipid Profile Cholesterol   Date Value Ref Range Status   10/15/2022 290 (H) <200 Final     HDL   Date Value Ref Range Status   10/15/2022 34 (L) >=40 Final     LDL   Date Value Ref Range Status   01/21/2022 76  Final     Triglycerides   Date Value Ref Range Status   10/15/2022 830 (H) <150 Final      Blood Sugar Hemoglobin A1C   Date Value Ref Range Status   06/20/2022 13.7 (H) 4.5 - 6.5 Final     Glucose   Date Value Ref Range Status   10/15/2022 340 (H) 70 - 105 Final   02/19/2021 345 (H) 70 - 105 Final   06/11/2019 128 (H) 70 - 105 Final     Glucose, POC   Date Value Ref Range Status 02/20/2019 233 (H) 70 - 100 MG/DL Final   62/13/0865 784 (H) 70 - 100 MG/DL Final   69/62/9528 413 (H) 70 - 100 MG/DL Final          Problems Addressed Today  Encounter Diagnoses  Name Primary?   ? Coronary artery disease due to lipid rich plaque        Assessment and Plan       CAD (coronary artery disease)  Her last ischemia assessment was a regadenoson stress test in 03/2019, done for some recurrent symptoms a couple of months after LAD stenting.  That perfusion study looked normal, again a couple of months after stent placement.    Her current chest discomfort symptoms aren't particularly suggestive of angina, but her symptoms in the past have been atypical.  The rather sudden-onset exertional dyspnea and negative work-up so far leave a question as to whether she has progression of CAD.  She's been difficult to image non-invasively in the past and we talked through diagnostic options.  She's elected to proceed with coronary arteriography with PCI, if indicated.      Current Medications (including today's revisions)  ? acetaminophen (TYLENOL) 500 mg tablet Take two tablets by mouth every 6-8 hours as needed.   ? albuterol sulfate (PROAIR HFA) 90 mcg/actuation HFA aerosol inhaler    ? amitriptyline (ELAVIL) 25 mg tablet Take one tablet by mouth at bedtime as needed.   ? aspirin EC 81 mg tablet Take 1 Tab by mouth daily. Take with food.   ? atorvastatin (LIPITOR) 80 mg tablet Take one tablet by mouth daily.   ? CHOLEcalciferoL (vitamin D3) 50 mcg (2,000 unit) capsule Take one capsule by mouth daily.   ? diclofenac (VOLTAREN) 1 % topical gel Apply four g topically to affected area four times daily as needed.   ? dimenhyDRINATE(+) 50 mg tab Take one tablet by mouth every 6 hours as needed.   ? diphenhydrAMINE (BENADRYL) 25 mg PO capsule Take two capsules by mouth every 6 hours as needed.   ? duloxetine DR (CYMBALTA) 30 mg capsule Take one capsule by mouth twice daily.   ? EPINEPHrine (EPIPEN) 1 mg/mL injection pen (2-Pack) Inject 0.3 mL into the muscle once as needed. Inject 0.3 mg (1 Pen) into thigh if needed for anaphylactic reaction. May repeat in 5-15 minutes if needed.   ? ezetimibe (ZETIA) 10 mg tablet TAKE 1 TABLET BY MOUTH EVERY DAY (NEED NEW INS CARD)   ? glimepiride (AMARYL) 4 mg tablet Take one tablet by mouth twice daily.   ? hydrOXYzine HCL (ATARAX) 25 mg tablet Take one tablet by mouth daily.   ? ibuprofen (ADVIL) 200 mg tablet Take three tablets by mouth every 6 hours as needed for Pain. Take with food.   ? insulin detemir U-100(+) (LEVEMIR) 100 unit/mL vial Inject thirty two Units under the skin twice daily. (Patient taking differently: Inject fifty Units under the skin twice daily.)   ? nitroglycerin (NITROSTAT) 0.4 mg tablet Place one tablet under tongue every 5 minutes as needed for Chest Pain.   ? other medication one Dose. GOLO Weight Loss Supplements   ? other medication one Dose. Nervive Nerve Relief     Total time spent on today's office visit was 45 minutes.  This includes face-to-face in person visit with patient as well as nonface-to-face time including review of the EMR, outside records, labs, radiologic studies, echocardiogram & other cardiovascular studies, formation of treatment plan, after visit summary, future disposition, and lastly on documentation.

## 2022-10-23 ENCOUNTER — Encounter: Admit: 2022-10-23 | Discharge: 2022-10-23 | Payer: MEDICARE

## 2022-10-23 NOTE — Progress Notes
Medicare Primary No pre-certification is required.

## 2022-10-28 ENCOUNTER — Encounter: Admit: 2022-10-28 | Discharge: 2022-10-28 | Payer: MEDICARE

## 2022-10-28 DIAGNOSIS — I2510 Atherosclerotic heart disease of native coronary artery without angina pectoris: Secondary | ICD-10-CM

## 2022-10-28 DIAGNOSIS — I10 Essential (primary) hypertension: Secondary | ICD-10-CM

## 2022-10-28 DIAGNOSIS — E7800 Pure hypercholesterolemia, unspecified: Secondary | ICD-10-CM

## 2022-10-28 LAB — CBC
HEMATOCRIT: 44
HEMOGLOBIN: 14 K/UL (ref 0–0.20)
MCH: 28
MCHC: 32
MCV: 88
MPV: 10
PLATELET COUNT: 269
RBC COUNT: 5 K/UL (ref 0–0.45)
RDW: 13
WBC COUNT: 6.2 K/UL — ABNORMAL HIGH (ref 0–0.80)

## 2022-10-28 LAB — BASIC METABOLIC PANEL
ANION GAP: 10 K/UL (ref 1.8–7.0)
BLD UREA NITROGEN: 11 FL (ref 7–11)
CALCIUM: 9.5 % (ref 4–12)
CHLORIDE: 101 % — ABNORMAL HIGH (ref 11–15)
CO2: 25 K/UL (ref 150–400)
CREATININE: 1.1 % — ABNORMAL HIGH (ref 0.57–1.11)
GFR ESTIMATED: 51 % — ABNORMAL LOW (ref >59)
GLUCOSE,PANEL: 296 % — ABNORMAL HIGH (ref 70–105)
POTASSIUM: 4.7 g/dL (ref 32.0–36.0)
SODIUM: 136 pg (ref 26–34)

## 2022-10-28 NOTE — Telephone Encounter
Patient called requesting lab orders be sent to [facility].     Orders faxed to [phone]

## 2022-11-04 ENCOUNTER — Encounter: Admit: 2022-11-04 | Discharge: 2022-11-04 | Payer: MEDICARE

## 2022-11-04 DIAGNOSIS — E785 Hyperlipidemia, unspecified: Secondary | ICD-10-CM

## 2022-11-04 DIAGNOSIS — F32A Depression: Secondary | ICD-10-CM

## 2022-11-04 DIAGNOSIS — I2510 Atherosclerotic heart disease of native coronary artery without angina pectoris: Secondary | ICD-10-CM

## 2022-11-04 DIAGNOSIS — I10 Essential (primary) hypertension: Secondary | ICD-10-CM

## 2022-11-04 MED ADMIN — INSULIN GLARGINE 100 UNIT/ML (3 ML) SC INJ PEN [163596]: 20 [IU] | SUBCUTANEOUS | @ 18:00:00 | Stop: 2022-11-04 | NDC 00088221905

## 2022-11-04 MED ADMIN — INSULIN ASPART 100 UNIT/ML SC FLEXPEN [87504]: 24 [IU] | SUBCUTANEOUS | @ 15:00:00 | Stop: 2022-11-04 | NDC 00169633910

## 2022-11-04 MED ADMIN — INSULIN REGULAR IN 0.9 % NACL 100 UNIT/100 ML (1 UNIT/ML) IV SOLN [451023]: 4 [IU]/h | INTRAVENOUS | @ 16:00:00 | Stop: 2022-11-04 | NDC 00338012612

## 2022-11-05 ENCOUNTER — Encounter: Admit: 2022-11-05 | Discharge: 2022-11-05 | Payer: MEDICARE

## 2022-11-05 MED FILL — REPATHA SURECLICK 140 MG/ML SC PNIJ: 140 mg/mL | SUBCUTANEOUS | 28 days supply | Qty: 2 | Fill #1 | Status: AC

## 2022-11-05 NOTE — Progress Notes
Pharmacy Benefits Investigation    Medication name: REPATHA SURECLICK 140 MG/ML SC PNIJ    Contacted Nicole Pace to request insurance information to obtain authorization for their medication. Left voicemail asking patient to return call to the specialty pharmacy at [phone].    The out of pocket cost is $542.67.    A copay card was obtained and will provide the patient with up to $542.67    After assistance, the final out of pocket cost is $0.    [name] stated the copay is affordable. Repatha Free Trial Card    [facility]  Specialty Pharmacy Patient Advocate

## 2022-11-15 ENCOUNTER — Encounter: Admit: 2022-11-15 | Discharge: 2022-11-15 | Payer: MEDICARE

## 2022-11-19 ENCOUNTER — Encounter: Admit: 2022-11-19 | Discharge: 2022-11-19 | Payer: MEDICARE

## 2023-03-14 IMAGING — CR [ID]
2 series · 2 of 2 positions shown · non-contrast
Comparison: none

[chest ap]
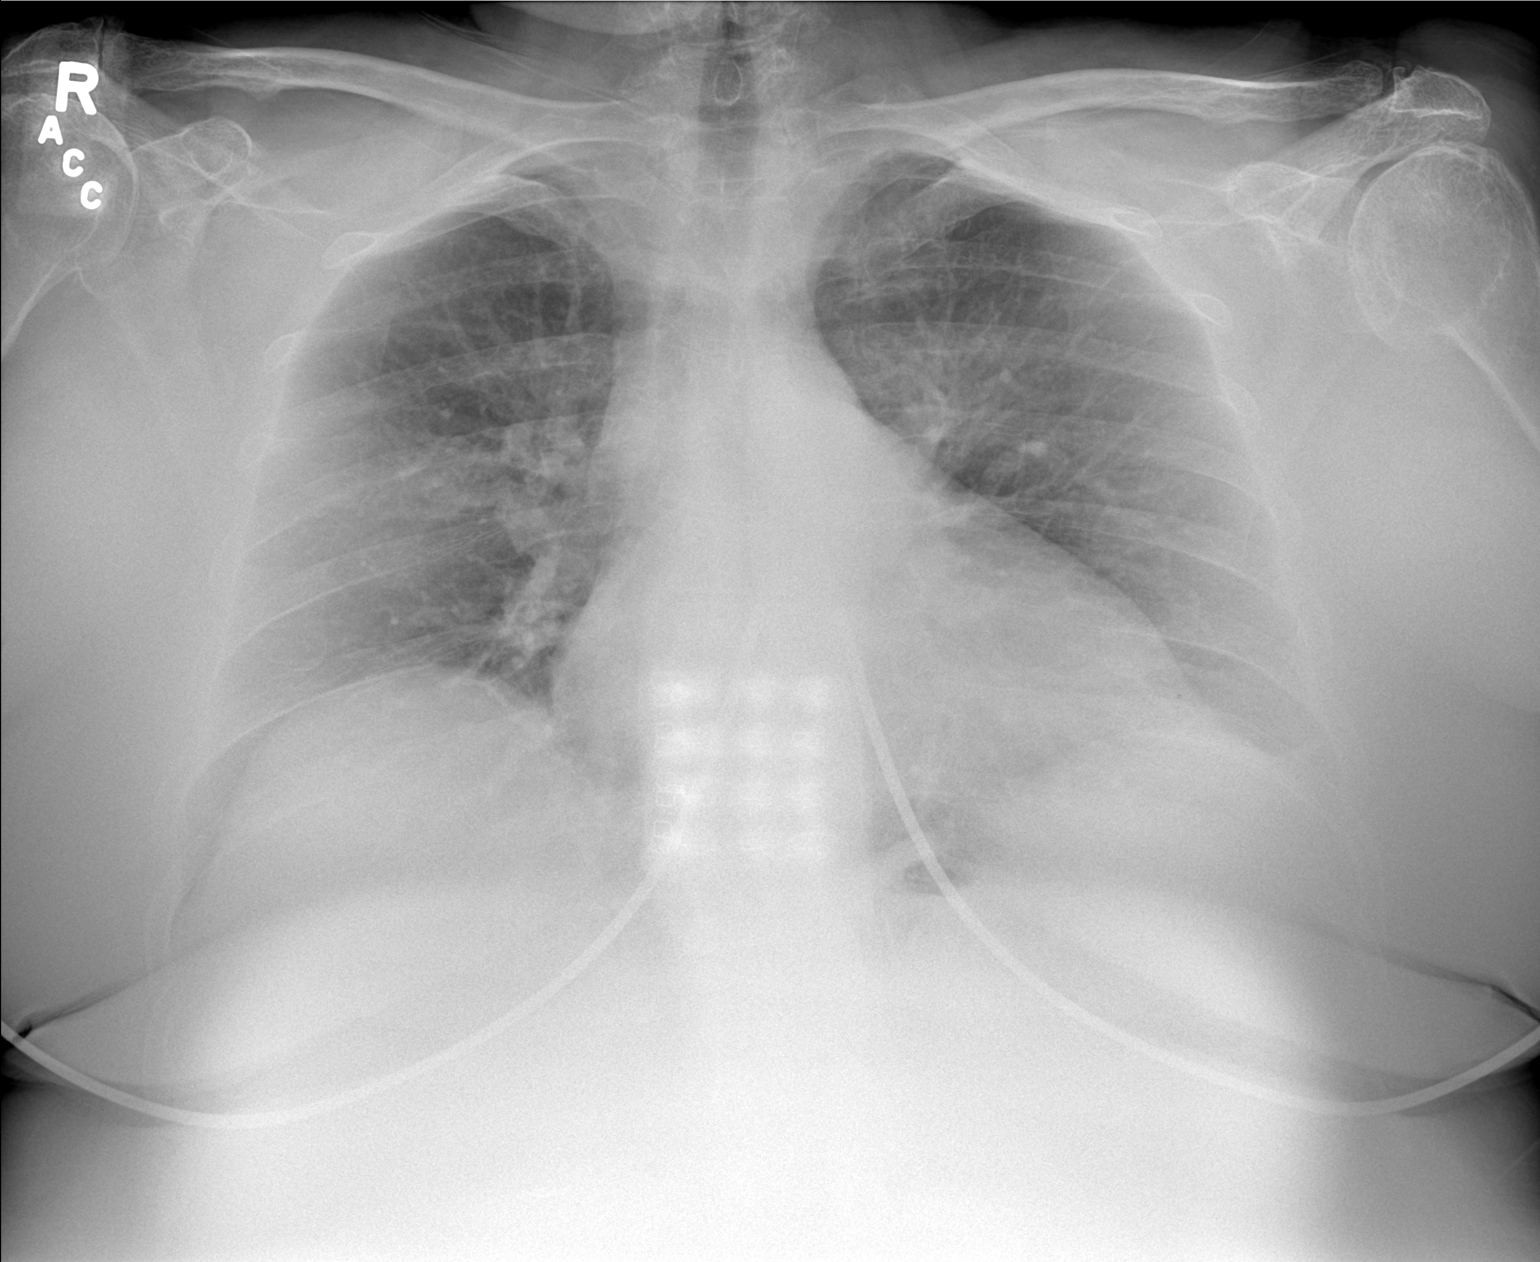

[chest lat]
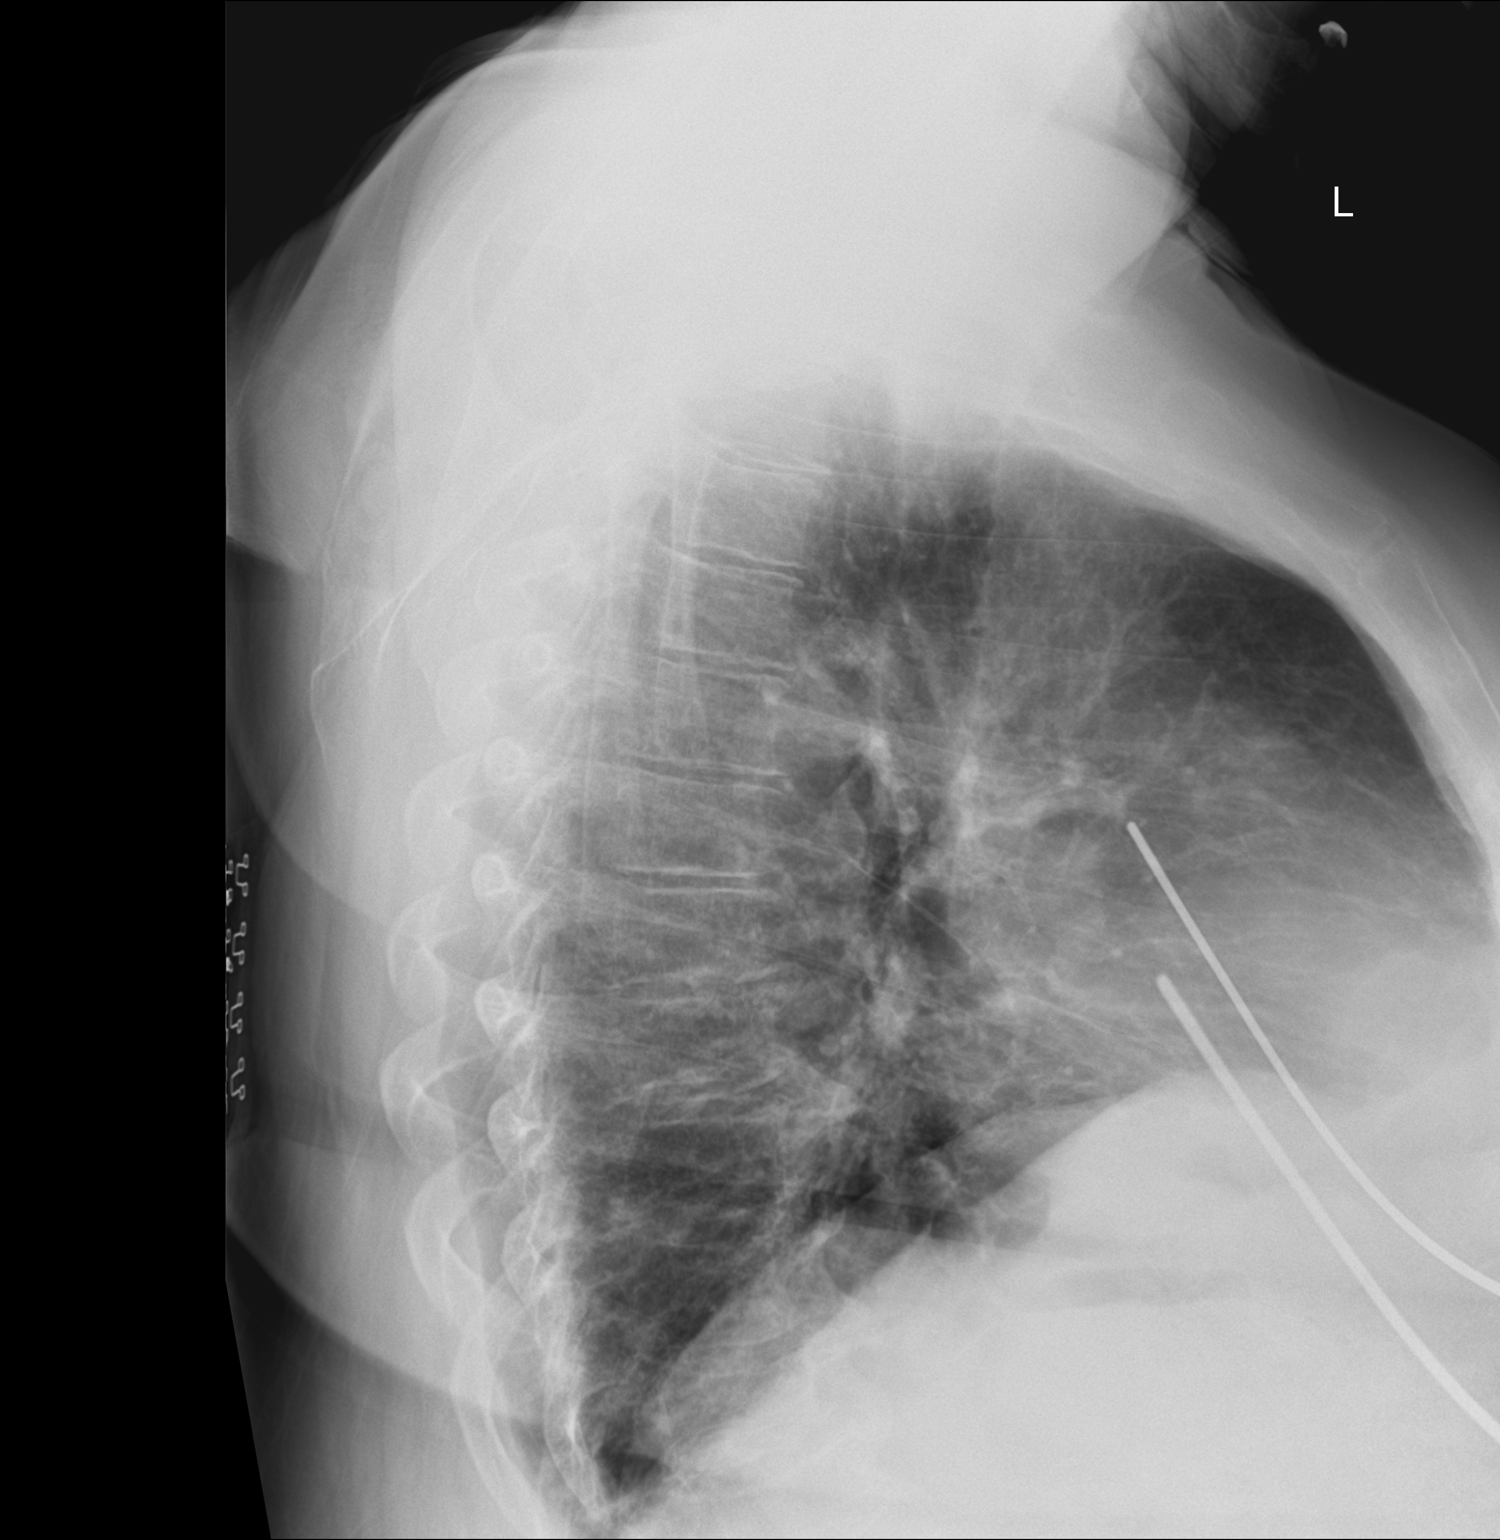

[2 of 2 positions shown; findings below may reference images not displayed]

EXAM

CHEST 2 VIEW

INDICATION

L post and lateral lower rib pain w cough
COUGH, CONGESTION, SOA STARTING [REDACTED], 10 DAYS FOOT SX.  RG

TECHNIQUE

2 views of the chest were acquired.

COMPARISONS

Thirty-first 0500

FINDINGS

The cardiomediastinal silhouette is within normal limits.  There is no failure or effusion.

There is minimal posterior basilar scarring suspected.

IMPRESSION

No acute lung process.

Tech Notes:

COUGH, CONGESTION, SOA STARTING [REDACTED], 10 DAYS FOOT SX.  RG

## 2023-04-07 ENCOUNTER — Encounter: Admit: 2023-04-07 | Discharge: 2023-04-07 | Payer: MEDICARE

## 2023-04-07 MED ORDER — EZETIMIBE 10 MG PO TAB
10 mg | ORAL_TABLET | Freq: Every day | ORAL | 11 refills | Status: AC
Start: 2023-04-07 — End: ?

## 2023-11-06 ENCOUNTER — Encounter: Admit: 2023-11-06 | Discharge: 2023-11-06 | Payer: MEDICARE

## 2023-11-13 ENCOUNTER — Encounter: Admit: 2023-11-13 | Discharge: 2023-11-13 | Payer: MEDICARE

## 2023-11-13 ENCOUNTER — Ambulatory Visit: Admit: 2023-11-13 | Discharge: 2023-11-13 | Payer: MEDICARE

## 2023-11-13 DIAGNOSIS — E7800 Pure hypercholesterolemia, unspecified: Secondary | ICD-10-CM

## 2023-11-13 DIAGNOSIS — E1165 Type 2 diabetes mellitus with hyperglycemia: Secondary | ICD-10-CM

## 2023-11-13 DIAGNOSIS — I10 Essential (primary) hypertension: Secondary | ICD-10-CM

## 2023-11-13 DIAGNOSIS — I2510 Atherosclerotic heart disease of native coronary artery without angina pectoris: Secondary | ICD-10-CM

## 2023-11-13 LAB — LIPID PROFILE
CHOLESTEROL/HDL %: 5
CHOLESTEROL: 168
HDL: 34 — ABNORMAL LOW (ref >=40)
LDL: 93
TRIGLYCERIDES: 208 — ABNORMAL HIGH (ref <150)
VLDL: 42 — ABNORMAL HIGH (ref 5–40)

## 2023-11-13 NOTE — Assessment & Plan Note
Hgb A1c 13.7 in mid-2023.

## 2023-11-13 NOTE — Assessment & Plan Note
Angina symptoms and abnormal stress test in 2010 led to mid-LAD stent.  Most recent cath in 10/2022 showed 80% ostial stenosis in a small/moderate diagonal branch arising from along the stented segment of LAD; no other high-grade lesions seen.

## 2023-11-13 NOTE — Progress Notes
Date of Service: 11/13/2023    Nicole Pace is a 67 y.o. female.       HPI     Nicole Pace was in the [facility] today for follow-up regarding coronary disease.  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       She is currently undergoing treatment for a non-healing foot wound that started when she tripped over a carpet strip at home.    She's had absolutely no problems with chest discomfort, dyspnea, or palpitations.         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  Presented with intermittent chest pain and abnormal stress test.  07/2010  Abnormal stress thallium  (antero-apical ischemia)  09/04/10 Heart Cath:  Non-obstructive Coronary disease, Mild ISR LAD(mid) tubular 20% lesion, LV EF 65%  12/2012 Cath:  LAD stent patent.  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  Mild disease of the left circumflex artery and RCA.      Hypertension 10/03/2010    Hyperlipidemia 10/03/2010    Type 2 diabetes mellitus with hyperglycemia, with long-term current use of insulin (HCC) 10/03/2010    Depression 10/03/2010    Sleep apnea 10/03/2010     Sleep study 09/13/09 Recommendations CPAP with 14cm           Review of Systems   Constitutional: Negative.   HENT: Negative.     Eyes: Negative.    Cardiovascular: Negative.    Respiratory: Negative.     Endocrine: Negative.    Hematologic/Lymphatic: Negative.    Skin: Negative.    Musculoskeletal: Negative.    Gastrointestinal: Negative.    Genitourinary: Negative.    Neurological: Negative.    Psychiatric/Behavioral: Negative.     Allergic/Immunologic: Negative.        Physical Exam    Physical Exam   General Appearance: no distress   Skin: warm, no ulcers or xanthomas   Digits and Nails: no cyanosis or clubbing   Eyes: conjunctivae and lids normal, pupils are equal and round   Teeth/Gums/Palate: dentition unremarkable, no lesions   Lips & Oral Mucosa: no pallor or cyanosis   Neck Veins: normal JVP , neck veins are not distended   Thyroid: no nodules, masses, tenderness or enlargement   Chest Inspection: chest is normal in appearance   Respiratory Effort: breathing comfortably, no respiratory distress  Auscultation/Percussion: lungs clear to auscultation, no rales or rhonchi, no wheezing   PMI: PMI not enlarged or displaced   Cardiac Rhythm: regular rhythm and normal rate   Cardiac Auscultation: S1, S2 normal, no rub, no gallop   Murmurs: no murmur   Peripheral Circulation: normal peripheral circulation   Carotid Arteries: normal carotid upstroke bilaterally, no bruits   Radial Arteries: normal symmetric radial pulses   Abdominal Aorta: no abdominal aortic bruit   Pedal Pulses: normal symmetric pedal pulses   Lower Extremity Edema: no lower extremity edema   Abdominal Exam: soft, non-tender, no masses, bowel sounds normal   Liver & Spleen: no organomegaly   Gait & Station: walks without assistance   Muscle Strength: normal muscle tone   Orientation: oriented to time, place and person Affect & Mood: appropriate and sustained affect   Language and Memory: patient responsive and seems to comprehend information   Neurologic Exam: neurological assessment grossly intact   Other: moves all extremities      Cardiovascular Health Factors  Vitals BP Readings from Last 3 Encounters:   11/13/23 118/87   11/04/22 106/64   10/22/22 122/72     Wt Readings from Last 3 Encounters:   11/13/23 112.9 kg (248 lb 12.8 oz)   11/04/22 112.2 kg (247 lb 6.4 oz)   10/22/22 113.9 kg (251 lb)     BMI Readings from Last 3 Encounters:   11/13/23 38.97 kg/m?   11/04/22 38.75 kg/m?   10/22/22 38.73 kg/m?      Smoking Social History     Tobacco Use   Smoking Status Never   Smokeless Tobacco Never      Lipid Profile Cholesterol   Date Value Ref Range Status   11/04/2022 182 <200 MG/DL Final     HDL   Date Value Ref Range Status   11/04/2022 38 (L) >40 MG/DL Final     LDL   Date Value Ref Range Status   11/04/2022 125 (H) <100 mg/dL Final     Triglycerides   Date Value Ref Range Status   11/04/2022 296 (H) <150 MG/DL Final      Blood Sugar Hemoglobin A1C   Date Value Ref Range Status   06/20/2022 13.7 (H) 4.5 - 6.5 Final     Glucose   Date Value Ref Range Status   11/04/2022 502 (HH) 70 - 100 MG/DL Final     Comment:     CRITICAL VALUE CALLED TO AND READ BACK BY/TIME/TECH  RN B HOWARD at 11/04/2022 08:57:51 by [id number]     10/28/2022 296 (H) 70 - 105 Final   10/15/2022 340 (H) 70 - 105 Final     Glucose, POC   Date Value Ref Range Status   11/04/2022 175 (H) 70 - 100 MG/DL Final   95/62/1308 657 (H) 70 - 100 MG/DL Final   84/69/6295 85 70 - 100 MG/DL Final          Problems Addressed Today  Encounter Diagnoses   Name Primary?    Coronary artery disease due to lipid rich plaque Yes    Pure hypercholesterolemia     Primary hypertension     Type 2 diabetes mellitus with hyperglycemia, with long-term current use of insulin (HCC)        Assessment and Plan       CAD (coronary artery disease)  Angina symptoms and abnormal stress test in 2010 led to mid-LAD stent.  Most recent cath in 10/2022 showed 80% ostial stenosis  in a small/moderate diagonal branch arising from along the stented segment of LAD; no other high-grade lesions seen.    Hyperlipidemia  Lab Results   Component Value Date    CHOL 182 11/04/2022    TRIG 296 (H) 11/04/2022    HDL 38 (L) 11/04/2022    LDL 125 (H) 11/04/2022    VLDL 59 11/04/2022    NONHDLCHOL 144 11/04/2022    CHOLHDLC 9 (H) 10/15/2022      LDL goal should be < 70.  Atorva 80, Zetia 10.  We'll update her lipid profile and start the PA for Repatha if necessary.    Hypertension  No current BP-lowering medications.  Her home BP is lower than 120/70.    Type 2 diabetes mellitus with hyperglycemia, with long-term current use of insulin (HCC)    Hgb A1c 13.7 in mid-2023.    Current Medications (including today's revisions)   acetaminophen (TYLENOL) 500 mg tablet Take two tablets by mouth every 6-8 hours as needed.    albuterol sulfate (PROAIR HFA) 90 mcg/actuation HFA aerosol inhaler     amitriptyline (ELAVIL) 25 mg tablet Take one tablet by mouth at bedtime as needed.    amoxicillin-potassium clavulanate (AUGMENTIN) 875/125 mg tablet Take one tablet by mouth twice daily.    aspirin EC 81 mg tablet Take 1 Tab by mouth daily. Take with food.    atorvastatin (LIPITOR) 80 mg tablet Take one tablet by mouth daily.    busPIRone (BUSPAR) 5 mg tablet Take one tablet by mouth twice daily.    dimenhyDRINATE(+) 50 mg tab Take one tablet by mouth every 6 hours as needed.    diphenhydrAMINE (BENADRYL) 25 mg PO capsule Take two capsules by mouth every 6 hours as needed.    duloxetine DR (CYMBALTA) 30 mg capsule Take one capsule by mouth twice daily.    EPINEPHrine (EPIPEN) 1 mg/mL injection pen (2-Pack) Inject 0.3 mL into the muscle once as needed. Inject 0.3 mg (1 Pen) into thigh if needed for anaphylactic reaction. May repeat in 5-15 minutes if needed.    ezetimibe (ZETIA) 10 mg tablet TAKE ONE TABLET BY MOUTH EVERY DAY    glimepiride (AMARYL) 4 mg tablet Take one tablet by mouth twice daily.    ibuprofen (ADVIL) 200 mg tablet Take three tablets by mouth every 6 hours as needed for Pain. Take with food.    insulin detemir U-100(+) (LEVEMIR) 100 unit/mL vial Inject thirty two Units under the skin twice daily. (Patient taking differently: Inject fifty Units under the skin twice daily.)    nitroglycerin (NITROSTAT) 0.4 mg tablet Place one tablet under tongue every 5 minutes as needed for Chest Pain.    other medication one Dose. GOLO Weight Loss Supplements    other medication one Dose. Nervive Nerve Relief     Total time spent on today's office visit was 45 minutes.  This includes face-to-face in person visit with patient as well as nonface-to-face time including review of the EMR, outside records, labs, radiologic studies, echocardiogram & other cardiovascular studies, formation of treatment plan, after visit summary, future disposition, and lastly on documentation.

## 2023-11-24 ENCOUNTER — Encounter: Admit: 2023-11-24 | Discharge: 2023-11-24 | Payer: MEDICARE

## 2023-11-24 MED ORDER — REPATHA SURECLICK 140 MG/ML SC PNIJ
140 mg | SUBCUTANEOUS | 11 refills | 28.00000 days | Status: AC
Start: 2023-11-24 — End: ?
  Filled 2023-12-31: qty 2, 28d supply, fill #1

## 2023-11-24 NOTE — Telephone Encounter
Called and discussed results with patient.  No questions at this time.  Pt will callback with any questions, concerns or problems.

## 2023-11-24 NOTE — Telephone Encounter
-----   Message from Jonelle Sports, MD sent at 11/24/2023  7:54 AM CST -----  Eugenie Birks, if she's willing let's start the PA for Repatha.  It would get her off Zetia and on a lower dose of atorva.  Trying to get LDL < 70.  ----- Message -----  From: Rogelia Boga, RN  Sent: 11/13/2023   2:02 PM CST  To: Vanice Sarah, MD    Labs for your review, LDL 93, pt is on atorvastatin 80mg  daily.  Please let me know if you have recommendations.  Thank you!

## 2023-11-25 ENCOUNTER — Encounter: Admit: 2023-11-25 | Discharge: 2023-11-25 | Payer: MEDICARE

## 2023-11-26 ENCOUNTER — Encounter: Admit: 2023-11-26 | Discharge: 2023-11-26 | Payer: MEDICARE

## 2023-11-26 NOTE — Progress Notes
Pharmacy Benefits Investigation    Medication name: evolocumab (REPATHA SURECLICK) 140 mg/mL injectable PEN  Medication status: new    The insurance requires a prior authorization for the medication. The prior authorization was submitted via CoverMyMeds.    PA number: [id number]    Lucie Leather  Specialty Pharmacy Patient Advocate

## 2023-11-27 ENCOUNTER — Encounter: Admit: 2023-11-27 | Discharge: 2023-11-27 | Payer: MEDICARE

## 2023-11-28 ENCOUNTER — Encounter: Admit: 2023-11-28 | Discharge: 2023-11-28 | Payer: MEDICARE

## 2023-11-28 NOTE — Progress Notes
Pharmacy Benefits Investigation    Medication name: evolocumab (REPATHA SURECLICK) 140 mg/mL injectable PEN  Medication status: new    The prior authorization was approved for Nicole Pace (PA number [id number]) from 11/26/2023 through 05/26/2024.  Thalia Bloodgood  Specialty Pharmacy Patient Advocate

## 2023-12-03 ENCOUNTER — Encounter: Admit: 2023-12-03 | Discharge: 2023-12-03 | Payer: MEDICARE

## 2023-12-05 ENCOUNTER — Encounter: Admit: 2023-12-05 | Discharge: 2023-12-05 | Payer: MEDICARE

## 2023-12-10 ENCOUNTER — Encounter: Admit: 2023-12-10 | Discharge: 2023-12-10 | Payer: MEDICARE

## 2023-12-23 ENCOUNTER — Encounter: Admit: 2023-12-23 | Discharge: 2023-12-23 | Payer: MEDICARE

## 2023-12-30 ENCOUNTER — Encounter: Admit: 2023-12-30 | Discharge: 2023-12-30 | Payer: MEDICARE

## 2023-12-31 ENCOUNTER — Encounter: Admit: 2023-12-31 | Discharge: 2023-12-31 | Payer: MEDICARE

## 2024-01-15 ENCOUNTER — Encounter: Admit: 2024-01-15 | Discharge: 2024-01-15 | Payer: MEDICARE

## 2024-05-12 ENCOUNTER — Encounter: Admit: 2024-05-12 | Discharge: 2024-05-12 | Payer: MEDICARE

## 2024-05-26 ENCOUNTER — Encounter: Admit: 2024-05-26 | Discharge: 2024-05-26 | Payer: MEDICARE

## 2024-06-03 ENCOUNTER — Encounter: Admit: 2024-06-03 | Discharge: 2024-06-03 | Payer: MEDICARE

## 2024-06-15 ENCOUNTER — Encounter: Admit: 2024-06-15 | Discharge: 2024-06-15 | Payer: MEDICARE

## 2024-06-18 ENCOUNTER — Encounter: Admit: 2024-06-18 | Discharge: 2024-06-18 | Payer: MEDICARE

## 2024-07-14 ENCOUNTER — Encounter: Admit: 2024-07-14 | Discharge: 2024-07-14 | Payer: MEDICARE

## 2024-08-05 ENCOUNTER — Encounter: Admit: 2024-08-05 | Discharge: 2024-08-05 | Payer: MEDICARE

## 2024-10-17 ENCOUNTER — Encounter: Admit: 2024-10-17 | Discharge: 2024-10-17 | Payer: MEDICARE

## 2024-10-17 ENCOUNTER — Inpatient Hospital Stay: Admit: 2024-10-17 | Discharge: 2024-10-17 | Payer: MEDICARE

## 2024-10-17 ENCOUNTER — Inpatient Hospital Stay
Admission: EM | Admit: 2024-10-17 | Discharge: 2024-10-19 | Disposition: A | Discharge status: Home / Self Care | Payer: MEDICARE

## 2024-10-17 DIAGNOSIS — Z9861 Coronary angioplasty status: Secondary | ICD-10-CM

## 2024-10-17 DIAGNOSIS — I2510 Atherosclerotic heart disease of native coronary artery without angina pectoris: Secondary | ICD-10-CM

## 2024-10-17 DIAGNOSIS — I200 Unstable angina: Secondary | ICD-10-CM

## 2024-10-17 LAB — POC GLUCOSE: ~~LOC~~ BKR POC GLUCOSE: 81 mg/dL (ref 70–100)

## 2024-10-17 LAB — HIGH SENSITIVITY TROPONIN I, RANDOM: ~~LOC~~ BKR HIGH SENSITIVITY TROPONIN I: 3.2 ng/L (ref 26.0–<15.0)

## 2024-10-17 LAB — HEPARIN ASSAY (UNFRACTIONATED)

## 2024-10-17 LAB — CBC AND DIFF
~~LOC~~ BKR ABSOLUTE BASO COUNT: 0.1 10*3/uL (ref 0.00–0.20)
~~LOC~~ BKR ABSOLUTE EOS COUNT: 0.2 10*3/uL (ref 0.00–0.45)
~~LOC~~ BKR ABSOLUTE MONO COUNT: 0.4 10*3/uL (ref 0.00–0.80)
~~LOC~~ BKR LYMPHOCYTES %: 31 % — ABNORMAL HIGH (ref 24.0–44.0)
~~LOC~~ BKR MONOCYTES %: 7.7 % (ref 4.0–12.0)
~~LOC~~ BKR MPV: 8.9 fL (ref 7.0–11.0)
~~LOC~~ BKR NEUTROPHILS %: 55 % (ref 41.0–77.0)
~~LOC~~ BKR PLATELET COUNT: 232 10*3/uL (ref 150–400)
~~LOC~~ BKR RBC COUNT: 4.9 10*6/uL (ref 4.00–5.00)
~~LOC~~ BKR RDW: 14 % (ref 11.0–15.0)
~~LOC~~ BKR WBC COUNT: 5.3 10*3/uL (ref 4.50–11.00)

## 2024-10-17 LAB — TSH WITH FREE T4 REFLEX: ~~LOC~~ BKR TSH: 2 [IU]/mL (ref 0.35–5.00)

## 2024-10-17 LAB — COMPREHENSIVE METABOLIC PANEL
~~LOC~~ BKR ANION GAP: 12 10*3/uL (ref 3–12)
~~LOC~~ BKR CHLORIDE: 105 mmol/L (ref 98–110)
~~LOC~~ BKR GLOMERULAR FILTRATION RATE (GFR): >60 mL/min (ref >60–4.80)
~~LOC~~ BKR POTASSIUM: 4.4 mmol/L (ref 3.5–5.1)

## 2024-10-17 LAB — LACTIC ACID(LACTATE): ~~LOC~~ BKR LACTIC ACID: 0.9 mmol/L (ref 0.5–2.0)

## 2024-10-17 LAB — PROTIME INR (PT): ~~LOC~~ BKR PROTIME: 12 s (ref 9.9–14.2)

## 2024-10-17 LAB — PTT (APTT): ~~LOC~~ BKR PTT: 36 s (ref 24.0–36.5)

## 2024-10-17 MED ORDER — ATORVASTATIN 40 MG PO TAB
80 mg | Freq: Every day | ORAL | 0 refills | Status: DC
Start: 2024-10-17 — End: 2024-10-19
  Administered 2024-10-17 – 2024-10-19 (×3): 80 mg via ORAL

## 2024-10-17 MED ORDER — DEXTROSE 50 % IN WATER (D50W) IV SYRG
12.5-25 g | INTRAVENOUS | 0 refills | Status: DC | PRN
Start: 2024-10-17 — End: 2024-10-17

## 2024-10-17 MED ORDER — INSULIN ASPART 100 UNIT/ML SC FLEXPEN
0-6 [IU] | Freq: Before meals | SUBCUTANEOUS | 0 refills | Status: DC
Start: 2024-10-17 — End: 2024-10-19
  Administered 2024-10-19: 17:00:00 1 [IU] via SUBCUTANEOUS

## 2024-10-17 MED ORDER — DULOXETINE 30 MG PO CPDR
30 mg | Freq: Two times a day (BID) | ORAL | 0 refills | Status: DC
Start: 2024-10-17 — End: 2024-10-19
  Administered 2024-10-18 – 2024-10-19 (×4): 30 mg via ORAL

## 2024-10-17 MED ORDER — ASPIRIN 81 MG PO CHEW
81 mg | Freq: Every day | ORAL | 0 refills | Status: DC
Start: 2024-10-17 — End: 2024-10-18

## 2024-10-17 MED ORDER — AMITRIPTYLINE 25 MG PO TAB
25 mg | Freq: Every evening | ORAL | 0 refills | Status: DC | PRN
Start: 2024-10-17 — End: 2024-10-19

## 2024-10-17 MED ORDER — INSULIN ASPART 100 UNIT/ML SC FLEXPEN
0-12 [IU] | Freq: Before meals | SUBCUTANEOUS | 0 refills | Status: DC
Start: 2024-10-17 — End: 2024-10-17

## 2024-10-17 MED ORDER — SODIUM CHLORIDE 0.9% IV BOLUS
500 mL | Freq: Once | INTRAVENOUS | 0 refills | Status: CP
Start: 2024-10-17 — End: ?
  Administered 2024-10-18: 16:00:00 500 mL via INTRAVENOUS

## 2024-10-17 MED ORDER — HEPARIN (PORCINE) INITIAL BOLUS FOR CONTINUOUS INF (BAG)
70 [IU]/kg | Freq: Once | INTRAVENOUS | 0 refills | Status: CP
Start: 2024-10-17 — End: ?

## 2024-10-17 MED ORDER — ACETAMINOPHEN 325 MG PO TAB
650 mg | ORAL | 0 refills | Status: DC | PRN
Start: 2024-10-17 — End: 2024-10-19
  Administered 2024-10-17 – 2024-10-18 (×2): 650 mg via ORAL

## 2024-10-17 MED ORDER — NITROGLYCERIN IN 5 % DEXTROSE 50 MG/250 ML (200 MCG/ML) IV SOLN
.1-1.5 ug/kg/min | INTRAVENOUS | 0 refills | Status: DC
Start: 2024-10-17 — End: 2024-10-18
  Administered 2024-10-17: 23:00:00 0.1 ug/kg/min via INTRAVENOUS

## 2024-10-17 MED ORDER — DEXTROSE 50 % IN WATER (D50W) IV SYRG
12.5-25 g | INTRAVENOUS | 0 refills | Status: DC | PRN
Start: 2024-10-17 — End: 2024-10-19

## 2024-10-17 MED ORDER — INSULIN GLARGINE 100 UNIT/ML (3 ML) SC INJ PEN
40 [IU] | Freq: Every evening | SUBCUTANEOUS | 0 refills | Status: DC
Start: 2024-10-17 — End: 2024-10-17

## 2024-10-17 MED ORDER — HEPARIN (PORCINE) BOLUS FOR CONTINUOUS INFUSION (BAG) - XA STD
20-40 [IU]/kg | INTRAVENOUS | 0 refills | Status: DC | PRN
Start: 2024-10-17 — End: 2024-10-18

## 2024-10-17 MED ORDER — FAMOTIDINE (PF) 20 MG/2 ML IV SOLN
20 mg | Freq: Two times a day (BID) | INTRAVENOUS | 0 refills | Status: DC
Start: 2024-10-17 — End: 2024-10-19
  Administered 2024-10-18 – 2024-10-19 (×4): 20 mg via INTRAVENOUS

## 2024-10-17 MED ORDER — INSULIN GLARGINE 100 UNIT/ML (3 ML) SC INJ PEN
20 [IU] | Freq: Two times a day (BID) | SUBCUTANEOUS | 0 refills | Status: DC
Start: 2024-10-17 — End: 2024-10-19
  Administered 2024-10-18: 02:00:00 20 [IU] via SUBCUTANEOUS

## 2024-10-17 MED ORDER — EZETIMIBE 10 MG PO TAB
10 mg | Freq: Every day | ORAL | 0 refills | Status: DC
Start: 2024-10-17 — End: 2024-10-19
  Administered 2024-10-17 – 2024-10-19 (×3): 10 mg via ORAL

## 2024-10-17 MED ORDER — BUSPIRONE 5 MG PO TAB
5 mg | Freq: Two times a day (BID) | ORAL | 0 refills | Status: DC
Start: 2024-10-17 — End: 2024-10-19
  Administered 2024-10-18 – 2024-10-19 (×4): 5 mg via ORAL

## 2024-10-17 MED ORDER — METOPROLOL TARTRATE 25 MG PO TAB
25 mg | Freq: Two times a day (BID) | ORAL | 0 refills | Status: DC
Start: 2024-10-17 — End: 2024-10-19
  Administered 2024-10-18 (×2): 25 mg via ORAL

## 2024-10-17 MED ORDER — ASPIRIN 325 MG PO TAB
325 mg | Freq: Once | ORAL | 0 refills | Status: CP
Start: 2024-10-17 — End: ?
  Administered 2024-10-17: 22:00:00 325 mg via ORAL

## 2024-10-17 MED ORDER — HEPARIN (PORCINE) IN 5 % DEX 25,000 UNIT/250 ML(100 UNIT/ML) IV SOLP
0-3000 [IU]/h | INTRAVENOUS | 0 refills | Status: DC
Start: 2024-10-17 — End: 2024-10-18
  Administered 2024-10-17: 23:00:00 1000 [IU]/h via INTRAVENOUS
  Administered 2024-10-18: 09:00:00 800 [IU]/h via INTRAVENOUS

## 2024-10-17 NOTE — Progress Notes [1]
 RT Adult Assessment Note    NAME:Nicole Pace             MRN: [id number]             DOB:[birth year]          AGE: 68 y.o.  ADMISSION DATE: 10/17/2024             DAYS ADMITTED: LOS: 0 days    Additional Comments:  Impressions of the patient: Patient alert and oriented, no signs of distress at this time.  Intervention(s)/outcome(s): Refuses CPAP.      Vital Signs:  Pulse: 66  RR: 16 PER MINUTE  SpO2: 96 %  O2 Device: None (Room air)  Liter Flow:    O2%:      Breath Sounds:   Breath Sounds WDL: Within Defined Limits  Respiratory Effort:   Respiratory WDL: Within Defined Limits  Comments:

## 2024-10-17 NOTE — H&P [4]
 CICU History and Physical Examination      Name: Nicole Pace        Birthday: Jun 25, 1956                                MRN: [id number]    Admission Date: 10/17/2024                                                LOS: 0 days      Brief Hospital Course     Nicole Pace is a 68 y.o. woman with a PMH of venous insufficiency, arthritis, CAD s/p drug-eluting stents (LAD x1 in 2010, LAD x1 in 2020), hypertension, hyperlipidemia, insulin -dependent type 2 diabetes, and sleep apnea admitted as a transfer from [facility] for unstable angina.      Assessment & Plan       Principal Problem:    Chest pain, rule out acute myocardial infarction  Active Problems:    CAD (coronary artery disease)    Angina pectoris      NEURO/PSYCH  #No acute issues      PULMONARY  #OSA  - CXR: ordered  Plan:     > Pulm hygeine  > CPAP at night PRN      CARDIOVASCULAR  #CAD s/p PCI x2  #Unstable Angina  #HTN  #HLD  #Venous insufficiency  05/02/09 Heart Cath: Single vessel disease: LAD(mid) 85% discrete lesion treated with 3.5x83mm Medtronic endeavor sprint DES stent.  Presented with intermittent chest pain and abnormal stress test.  07/2010:  Abnormal stress thallium  (antero-apical ischemia)  09/04/10 Heart Cath: Non-obstructive Coronary disease, Mild ISR LAD(mid) tubular 20% lesion, LV EF 65%  12/2012 Cath: LAD stent patent. No significant obstructive lesions  02/12/19 Cath: revealed a high-grade in-stent restenosis of the previously-placed stent, extending slightly distal to the stent in the left anterior descending artery, treated with a 3.0 x 15 mm drug-eluting Xience stent.  Mild disease of the left circumflex artery and RCA.  11/04/22 Cath: The LAD is a type 3 LAD. It has stents in the proximal to midportion. The stents are patent with 30% in-stent restenosis in the proximal stent. The rest of the LAD is unremarkable. There is 1 small to moderate size diagonal arising within the stented area that has an 80% ostial stenosis.   - Pressors: None  - EKG: Sinus bradycardia  - Echo: ordered  - PTA meds: ASA 81 mg, atorvastatin  80 mg daily, Zetia  10 mg daily, Insulin  (Levemir  40u BID, Humalog 12u in AM and 30u with lunch and dinner), Buspar 5 mg twice daily, duloxetine 30 mg twice daily, amitriptyline 25 mg at bedtime as needed    Lab Results   Component Value Date/Time    HIGHSTROPI 3.2 10/17/2024 03:20 PM    NTPROBNP 51 10/17/2024 03:20 PM       Plan:  > Continue PTA aspirin , atorvastatin , Zetia , BuSpar, duloxetine, amitriptyline  > Hold insulin  for now as she has been NPO since 6 PM yesterday  > Add metoprolol  25 mg twice daily  > Heparin  gtt  > Nitro drip for chest pain, titrate to goal of <2/10  > Obtain TTE  > LHC orders placed  > If chest pain and/or back pain are worsening, consider CTA chest to r/o dissection  FEN/GI  # No acute issues    Plan:  > Continue to monitor  > Famotidine 20 mg BID for stress ulcer ppx      RENAL  # No acute issues  Lab Results   Component Value Date/Time    CR 0.94 10/17/2024 03:20 PM     No intake or output data in the 24 hours ending 10/17/24 1717     Plan:  > replace electrolytes PRN  > K >4, Mag >2      ENDOCRINE  #T2DM    Lab Results   Component Value Date/Time    GLUPOC 81 10/17/2024 04:31 PM     - PTA meds: Insulin  (Levemir  40u BID, Humalog 12u in AM and 30u with lunch and dinner), metformin  500 mg BID  Plan:  > Continue to monitor  > Start PTA Insulin  once eating       HEME/ONC  #No acute issues    Recent Labs     10/17/24  1520   HGB 14.7   MCV 89.7   PLTCT 232       Plan:  > Monitor HGB  > Transfuse if Hgb <7 or <8 with active bleeding  > DVT Ppx: SCD's, Heparin       ID  #No acute issues    Recent Labs     10/17/24  1520   WBC 5.30     - Temp (24hrs), Avg:36.4 ?C (97.5 ?F), Min:36.3 ?C (97.4 ?F), Max:36.4 ?C (97.6 ?F)      MSK/DERM  #No acute issues    LDA/PROPHYLAXIS  Lines: PIV  Tubes: None   Urinary Catheter:  No  Antibiotic Usage:  No  VTE ppx: SCDs, Heparin   GI:  H2RB  Bowel regimen: Pt having regular BMs  Diet: Diet Cardiac (Low Fat / Low Sodium)  Code status: FULL CODE      Patient discussed with attending physician, Dr. Dorise Ozell Bohr, DO  Board-eligible Pediatrician  Emergency Medicine Resident, PGY-2    _________________________________________________________________________  Subjective     History of Present Illness: Nicole Pace is a 68 y.o. female with a PMH of venous insufficiency, arthritis, CAD s/p drug-eluting stents (LAD x1 in 2010, LAD x1 in 2020), hypertension, hyperlipidemia, insulin -dependent type 2 diabetes, and sleep apnea admitted as a transfer from [facility] for unstable angina. Patient states that she has had intermittent chest pain for approximately 2 months. However, last night she developed chest pain at rest that did not resolve after 3 doses of sublingual nitroglycerin . Patient states that the chest pain is retrosternal and left-sided radiating to her back and the left side of her neck. It is currently a 7/10. She states that she has had 2 stents in the past and multiple left heart catheterizations for anginal chest pain. The pain she is experiencing currently is very similar to those prior episodes. The back pain, however, she states is somewhat new. She also endorses some mild SOB and exacerbation of the pain with deep breaths. She denies N/V, diaphoresis, vision changes, presyncope, syncope, fevers, recent illnesses or sick contacts, hemoptysis, abdominal pain, rashes or edema.  She reports being compliant with her meds at home.    At the outside hospital, she received 324 mg Aspirin , Tylenol  and was started on heparin  drip.  Her EKG, troponins and BNP were unremarkable.  Her initial potassium was noted to be 5.6, however this was rechecked and 4.0.    ROS:   Review of Systems  All 12 systems otherwise negative.       The patient  has a past medical history of CAD (coronary artery disease) (10/03/2010), Depression (10/03/2010), Diabetes mellitus type II (10/03/2010), Hyperlipidemia (10/03/2010), and Hypertension (10/03/2010).    The patient  has a past surgical history that includes cardiovascular stress test; Coronary angioplasty; Cardiac catheterization; coronary stent placement; hip replacement; tonsil and adenoidectomy; cesarean section; Femur Surgery; knee arthroscopy; carpal tunnel release; rotator cuff repair; and Hysterectomy, total abdominal.    Social History     Tobacco Use    Smoking status: Never    Smokeless tobacco: Never   Vaping Use    Vaping status: Never Used   Substance Use Topics    Alcohol use: Yes     Comment: rare    Drug use: No     family history includes Diabetes in her brother; High Cholesterol in her mother; Hypertension in her brother and brother.    Allergies:  Imdur  [isosorbide  mononitrate], Nuts, Other [unclassified drug], Artificial sweetner, Flu vaccine [influenza virus vaccines], Morphine, Tessalon [benzonatate], and Effient  [prasugrel ]      Objective     Scheduled Meds:Continuous Infusions:  PRN and Respiratory Meds:                           Vital Signs: Last Filed                 Vital Signs: 24 Hour Range   BP: 126/73 (10/26 1456)  Temp: 36.4 ?C (97.6 ?F) (10/26 1456)  Pulse: 61 (10/26 1456)  Respirations: 16 PER MINUTE (10/26 1456)  SpO2: 94 % (10/26 1456)  O2 Device: None (Room air) (10/26 1456)  Height: 170.2 cm (5' 7) (10/26 1300) BP: (126-141)/(73-76)   Temp:  [36.3 ?C (97.4 ?F)-36.4 ?C (97.6 ?F)]   Pulse:  [61]   Respirations:  [16 PER MINUTE]   SpO2:  [94 %-96 %]   O2 Device: None (Room air)     Vitals:    10/17/24 1300   Weight: 120.7 kg (266 lb)         Physical Exam:  Constitutional: 68 y.o. female AAOx3, resting in bed in no apparent distress  Head: Normocephalic, atraumatic  Eyes: Extra-occular muscles intact, PERRL, clear sclera  ENT: Nose midline without drainage, neck supple without tracheal deviation, oral mucous membrane moist  Cardiovascular: Regular rhythm, normal rate, normal S1 and S2, no murmur /rub/gallop  Pulmonary: Clear to auscultation bilaterally, no wheezes or rales  GI: Abdomen soft, non-tender, non-distended (baseline habitus), bowel sounds throughout  Skin: No rashes or bruises, good turgor, cap refill <2s  Neuro: Cranial nerves II-XII intact, no gross focal neurological deficits  Musculoskeletal: Moves all extremities well  Lymphatic/extremities: No pitting edema, pulses present b/l    Artificial Airway   None       Ventilator/Respiratory Therapy  No     Vent Weaning   Not applicable    Laboratory:  No results for input(s): NA, K, CL, CO2, GAP, BUN, CR, GLU, CA, ALBUMIN, MG, PO4, HGBA1C, TSH, CORT in the last 72 hours.    Recent Labs     10/17/24  1520   WBC 5.30   HGB 14.7   HCT 44.3   PLTCT 232   PT 12.0   INR 1.1   PTT 36.0      CrCl cannot be calculated (Patient's most recent lab result is older than the maximum 180 days allowed.).  Vitals:  10/17/24 1300   Weight: 120.7 kg (266 lb)    No results for input(s): PHART, PO2ART in the last 72 hours.    Invalid input(s): PC02A      Pertinent radiology reviewed.    Malnutrition Details:                                        Active Wounds

## 2024-10-18 ENCOUNTER — Encounter: Admit: 2024-10-18 | Discharge: 2024-10-18 | Payer: MEDICARE

## 2024-10-18 ENCOUNTER — Inpatient Hospital Stay: Admit: 2024-10-18 | Discharge: 2024-10-18 | Payer: MEDICARE

## 2024-10-18 ENCOUNTER — Inpatient Hospital Stay: Admit: 2024-10-18 | Discharge: 2024-10-19 | Payer: MEDICARE

## 2024-10-18 DIAGNOSIS — I209 Angina pectoris, unspecified: Secondary | ICD-10-CM

## 2024-10-18 LAB — 2D + DOPPLER ECHO
AORTIC VALVE STROKE VOLUME INDEX: 29
AV INDEX (NATIVE): 0.6
AV PEAK VELOCITY: 1.3 m/s
BSA: 2.3 m2
DOP CALC LVOT AREA: 2.5 cm2
DOP CALC LVOT DIAMETER: 1.8 cm
DOP CALC LVOT PEAK VEL VTI: 25 cm
DOP CALC LVOT PEAK VEL: 0.9 m/s
DOP CALC LVOT STROKE VOLUME: 63 cm3
E/A RATIO: 1
EJECTION FRACTION: 60 %
FRACTIONAL SHORTENING: 35 % (ref 28–44)
INTERVENTRICULAR SEPTUM: 1 cm (ref 0.6–0.9)
IVC PROX: 1.6 cm
LATERAL E/E' RATIO: 10
LEFT ATRIUM INDEX: 22 mL/m2 (ref 16–34)
LEFT ATRIUM SIZE: 4.8 cm (ref 2.7–3.8)
LEFT ATRIUM VOLUME: 53 mL (ref 22–52)
LEFT INTERNAL DIMENSION IN SYSTOLE: 2.9 cm (ref 2.2–3.5)
LEFT VENTRICLE DIASTOLIC VOLUME INDEX: 45 mL/m2 (ref 29–61)
LEFT VENTRICLE DIASTOLIC VOLUME: 106 mL (ref 46–106)
LEFT VENTRICLE MASS INDEX: 56 g/m2 (ref 43–95)
LEFT VENTRICLE SYSTOLIC VOLUME INDEX: 19 mL/m2 (ref 8–24)
LEFT VENTRICLE SYSTOLIC VOLUME: 45 mL (ref 14–42)
LEFT VENTRICULAR INTERNAL DIMENSION IN DIASTOLE: 4.5 cm (ref 3.8–5.2)
LEFT VENTRICULAR MASS: 133 g (ref 67–162)
MEDIAL E/E' RATIO: 14
MV PEAK A VEL: 0.8 m/s
MV PEAK E VEL PW: 0.8 m/s
POSTERIOR WALL: 0.8 cm (ref 0.6–0.9)
PROX AORTA: 3.2 cm (ref 1.9–3.5)
RA PRESSURE: 3
RELATIVE WALL THICKNESS: 0.3 (ref <=0.42)
RIGHT HEART SYSTOLIC MMODE TAPSE: 2.5 cm (ref >1.7)
RIGHT HEART SYSTOLIC TDI S': 0.1 m/s
RIGHT VENTRICULAR BASAL DIAMETER: 4 cm (ref 2.5–4.1)
SIMPSONS BIPLANE EF: 57 %
SINUS: 3.4 cm (ref 2.4–3.6)
TDI LATERAL E': 0 m/s
TDI MEDIAL E': 0 m/s

## 2024-10-18 LAB — HEPARIN ASSAY (UNFRACTIONATED)
~~LOC~~ BKR HEPARIN ASSAY: 0.9 [IU]/mL — ABNORMAL HIGH (ref 0.30–0.70)
~~LOC~~ BKR HEPARIN ASSAY: 0.1 [IU]/mL — ABNORMAL LOW (ref 0.30–0.70)

## 2024-10-18 LAB — POC ACTIVATED CLOTTING TIME (ISTAT): ~~LOC~~ BKR POCT ACT ISTAT: 314 s

## 2024-10-18 LAB — HIGH SENSITIVITY TROPONIN I, RANDOM: ~~LOC~~ BKR HIGH SENSITIVITY TROPONIN I: 3.4 ng/L (ref <15.0)

## 2024-10-18 LAB — LIPID PROFILE: ~~LOC~~ BKR TRIGLYCERIDES: 121 mg/dL (ref 10–<150)

## 2024-10-18 LAB — IRON + BINDING CAPACITY + %SAT+ FERRITIN: ~~LOC~~ BKR TRANSFERRIN: 284 mg/dL (ref 185–336)

## 2024-10-18 MED ORDER — ASPIRIN 81 MG PO CHEW
243 mg | Freq: Every day | ORAL | 0 refills | Status: DC
Start: 2024-10-18 — End: 2024-10-18

## 2024-10-18 MED ORDER — PERFLUTREN LIPID MICROSPHERES 1.1 MG/ML IV SUSP
1-10 mL | Freq: Once | INTRAVENOUS | 0 refills | Status: CP | PRN
Start: 2024-10-18 — End: ?
  Administered 2024-10-18: 14:00:00 2 mL via INTRAVENOUS

## 2024-10-18 MED ORDER — TICAGRELOR 90 MG PO TAB
90 mg | Freq: Two times a day (BID) | ORAL | 0 refills | Status: DC
Start: 2024-10-18 — End: 2024-10-19
  Administered 2024-10-19: 15:00:00 90 mg via ORAL

## 2024-10-18 MED ORDER — ASPIRIN 81 MG PO CHEW
81 mg | Freq: Every day | ORAL | 0 refills | Status: DC
Start: 2024-10-18 — End: 2024-10-19
  Administered 2024-10-19: 15:00:00 81 mg via ORAL

## 2024-10-18 MED ORDER — SODIUM CHLORIDE 0.9 % IJ SOLN
10 mL | Freq: Once | INTRAVENOUS | 0 refills | Status: CP
Start: 2024-10-18 — End: ?
  Administered 2024-10-18: 14:00:00 10 mL via INTRAVENOUS

## 2024-10-18 MED ORDER — ASPIRIN 81 MG PO CHEW
324 mg | Freq: Once | ORAL | 0 refills | Status: CP
Start: 2024-10-18 — End: ?
  Administered 2024-10-18: 14:00:00 324 mg via ORAL

## 2024-10-18 MED ORDER — MAGNESIUM SULFATE IN D5W 1 GRAM/100 ML IV PGBK
1 g | INTRAVENOUS | 0 refills | Status: CP
Start: 2024-10-18 — End: ?
  Administered 2024-10-18: 11:00:00 1 g via INTRAVENOUS

## 2024-10-18 NOTE — Progress Notes [1]
 Pre-Procedure Airway Assessment     Planned Procedure: cors possible    Time of last oral intake: prior MN    Assessment: No abnormalities    Note: If any factors present an anesthesia consult should be considered    Malampatti: II    ASA Class: ASA II (A normal patient with mild systemic disease)    Physician has discussed risks and alternatives of this type sedation and above planned procedure(s) with: Patient    Allergies:   Allergies[1]     Current Medications: Reviewed    Appropriate labs/diagnostic tests: Reviewed    Have the patient or anyone in the patient's family ever had a history of sedation/anesthesia complications? No         [1]   Allergies  Allergen Reactions    Imdur  [Isosorbide  Mononitrate] CHEST TIGHTNESS and HEADACHE    Nuts ANAPHYLAXIS     Tree nuts    Other [Unclassified Drug] ANAPHYLAXIS     Horseradish    Artificial Sweetner NAUSEA AND VOMITING    Flu Vaccine [Influenza Virus Vaccines] NAUSEA AND VOMITING and EDEMA     Arm swelling    Morphine HIVES    Tessalon [Benzonatate] ANAPHYLAXIS    Effient  [Prasugrel ] HEADACHE and ITCHING

## 2024-10-18 NOTE — Case Mgmt DC Plan [600024]
 Case Management Admission Assessment    NAME:Nicole Pace                          MRN: [id number]             DOB:[birth year]          AGE: 68 y.o.  ADMISSION DATE: 10/17/2024             DAYS ADMITTED: LOS: 1 day      Today?s Date: 10/18/2024    Source of Information: This CM met with pt for assessment on this date.  Provided contact information and explanation of SW/NCM roles.  Reviewed [facility], Preparing for Discharge, and [facility].  Provided opportunity for questions and discussion. Pt/family encouraged to contact Case Management team with questions and concerns during hospitalization and until patient is able to transition back to the patient's primary care physician.  -Patient and her husband live in their home in [place], [place].  Patient is independent with no DME use.  She had [facility] in the past and would use them again if needed.         Plan  Plan: Assist PRN with SW/NCM Services:  CM team will follow for d/c needs.      Patient Address/Phone  [address]  [place] [address]  [phone] (home) [phone] (work)    Science Writer  Extended Emergency Contact Information  Primary Emergency Contact: Bohnet,Jim   [place]   Home Phone: [phone]  Mobile Phone: [phone]  Relation: Spouse  Secondary Emergency Contact: Handke,Sabrina   [place]   Home Phone: [phone]  Mobile Phone: [phone]  Relation: Daughter    Healthcare Directive  Healthcare Directive: Yes, patient has a healthcare directive  Type of Healthcare Directive: Durable power of attorney for healthcare, Healthcare directive  Location of Healthcare Directive: Patient does not have it with him/her  Would patient like to fill out a (a new) Editor, Commissioning?: No, patient declined  Lawyer (Psych unit only): No, patient does not have a Social Research Officer, Government  Does the Patient Need Case Management to Arrange Discharge Transport? (ex: facility, ambulance, wheelchair/stretcher, Medicaid, cab, other): No  Will the Patient Use Family Transport?: Yes  Transportation Name, Phone and Availability #1: husband, Signe    Expected Discharge Date  10/20/2024     Living Situation Prior to Admission  Living Arrangements  Type of Residence: Home, independent  Living Arrangements: Spouse/significant other  Financial Risk Analyst / Tub: Psychologist, Counselling  How many levels in the residence?: 1  Can patient live on one level if needed?: Yes  Does residence have entry and/or inside stairs?: Yes (ramp)  Assistance needed prior to admit or anticipated on discharge: No  Level of Function   Prior level of function: Independent  Cognitive Abilities   Cognitive Abilities: Alert and Oriented, Engages in problem solving and planning, Participates in Radio Producer Resources  Coverage  Primary Insurance: Medicare  Secondary Insurance: Medicare Supplement  Additional Coverage: None  Medication Coverage    Medication Coverage: Medicare Part D  Medicare Part D Plan: BCBS  Have you experienced a noticeable increase in your copay costs recently?: No  Are current medications affordable?: Yes  Do You Use a Co-Pay Card or a Medication Assistance Program to Help Manage Medication Costs?: No  Do You Manage Your Own Medications?: Yes  Source of Income   Source  Of Income: SSI  Financial Assistance Needed?  no    Psychosocial Needs  Mental Health  Mental Health History: No  Substance Use History  Substance Use History Screen: No  Other  no    Current/Previous Services  PCP  Andee Norman LABOR, [phone], [phone]  Pharmacy    [facility] [place], [place] - [address]  [address]  [place] [address]  Phone: [phone] Fax: [phone]    [id number] [place], [place] - [address]  [address]  [place] [address]  Phone: [phone] Fax: [phone]    [facility] [place] [facility] - [address]  [address]  [place] [address]  Phone: [phone] Fax: [phone]    Durable Medical Equipment   Durable Medical Equipment at home: None  Home Health  Receiving home health: In the past  Agency name: [facility]  Would patient use this agency again?: Yes  Hemodialysis or Peritoneal Dialysis  Undergoing hemodialysis or peritoneal dialysis: No  Tube/Enteral Feeds  Receive tube/enteral feeds: No  Infusion  Receive infusions: No  Private Duty  Private duty help used: No  Home and Community Based Services  Home and community based services: No  Ryan White  Ryan White: N/A  Hospice  Hospice: No  Outpatient Therapy  PT: No  OT: No  SLP: No  Skilled Nursing Facility/Nursing Home  SNF: No  NH: No  Inpatient Rehab  IPR: No  Long-Term Acute Care Hospital  LTACH: No  Acute Hospital Stay  Acute Hospital Stay: In the past  Was patient's stay within the last 30 days?: No      Medford Lennie PEAK, CCM  Integrated Case Manager  [phone]

## 2024-10-18 NOTE — Progress Notes [1]
 HC5 END OF SHIFT/PLAN OF CARE NURSING NOTE   Nursing Shift: Day Shift 0700-1900    Acute events, nursing interventions, & communication with providers: Pt had LHC done today, went through R ulnar. TR band placed.   Once pt back on unit they endorsed 8/10 shoulder pain, radiating to elbow.  Dr. Ozell Bohr notified at 1754. Advised existing PRN tylenol . Tylenol  given as ordered at 1811.       Patient Goal(s)  Patient will Achieve timely healing; be free of purulent secretions, drainage, or erythema; and be afebrile by the end of next shift.        Patient will  Verbalize readiness for discharge by discharge.   Admission Weight: Weight: 120.7 kg (266 lb)    Last 3 Weights:   Vitals:    10/17/24 1300 10/18/24 0400 10/18/24 0405   Weight: 120.7 kg (266 lb) 118.8 kg (262 lb) 118.8 kg (262 lb)     Weight Change: Weight trend stable    Intake/Output Summary (Last 24 hours) at 10/18/2024 1431  Last data filed at 10/18/2024 1400  Gross per 24 hour   Intake 495 ml   Output 1650 ml   Net -1155 ml     Last Bowel Movement Date:  (PTA)    Fluid Restriction? No   Quality/Safety    Total Fall Risk Score: 7   Risk for Injury related to falls: Standard risk for injury based on the ABCS scoring tool  Fall Risk Category:   History of More Than One Fall Within 6 Months Before Admission: No  Elimination, Bowel and Urine: N/A  Interventions: N/A - Does not score for risk in this category  Medications: On 2 or more high fall risk drugs  Interventions: Stay within arm's reach during toileting/showering (i.e., dizziness, orthostasis)   Patient Care Equipment: One present  Interventions: N/A - Does not score as risk in this category  Mobility: 0 - No mobility issues  Interventions: N/A - Does not score as risk in this category  Cognition: 0 - No cognition issues  Interventions: N/A - Does not score as risk in this category    Other safety precautions in place: N/A    Restraints:  No      Patient Education  This RN provided education to Patient today. The following education topics were reviewed:  Quality/Safety Education:   Pain scale  Medication Education:   Medication management (Indication, adverse effects, monitoring, etc)  Education provided on the following medication(s):all medications given during shift  Cardiac - Specific Education:   Heart failure management  General Education:   Diet/nutrition    The following teaching method(s) were used: Verbal  Response to learning: Verbalizes Understanding  Needs reinforcement on: n/a

## 2024-10-18 NOTE — Progress Notes [1]
 CICU Progress Note      Name: Nicole Pace        Birthday: [birth year]                                MRN: [id number]  Admission Date: 10/17/2024                                                LOS: 1 day      Brief Hospital Course     Leontyne is a 68 y.o. woman with PMH of venous insufficiency, arthritis, CAD s/p drug-eluting stents (LAD x1 in 2010, LAD x1 in 2020), hypertension, hyperlipidemia, insulin -dependent type 2 diabetes, and sleep apnea admitted as a transfer from [facility] for unstable angina.     Chest pain has resolved with Nitroglycerin  gtt.     LHC today.    Interval update 10/18/2024:  > NAEO  > Chest pain has resolved as of this morning  > LDL 98  > TTE unremarkable    Assessment & Plan     Principal Problem:    Chest pain, rule out acute myocardial infarction  Active Problems:    CAD (coronary artery disease)    Angina pectoris        NEURO/PSYCH  #Depression  #Insomnia  - PTA meds: Buspar 5 mg twice daily, duloxetine 30 mg twice daily, amitriptyline 25 mg at bedtime as needed  Plan:  > continue PTA Buspar, Duloxetine, and Amitriptyline        PULMONARY  #OSA  - CXR: ordered  Plan:  > Pulm hygeine  > CPAP at night PRN      CARDIOVASCULAR  #CAD s/p PCI x2  #Unstable Angina  #HTN  #HLD  #Venous insufficiency  -05/02/09 Heart Cath: Single vessel disease: LAD (mid) 85% discrete lesion treated with 3.5x79mm Medtronic endeavor sprint DES stent.  Presented with intermittent chest pain and abnormal stress test.  -07/2010:  Abnormal stress thallium  (antero-apical ischemia)  -09/04/10 Heart Cath: Non-obstructive Coronary disease, Mild ISR LAD(mid) tubular 20% lesion, LV EF 65%  -12/2012 Cath: LAD stent patent. No significant obstructive lesions  -02/12/19 Cath: revealed a high-grade in-stent restenosis of the previously-placed stent, extending slightly distal to the stent in the left anterior descending artery, treated with a 3.0 x 15 mm drug-eluting Xience stent.  Mild disease of the left circumflex artery and RCA.  -11/04/22 Cath: The LAD is a type 3 LAD. It has stents in the proximal to midportion. The stents are patent with 30% in-stent restenosis in the proximal stent. The rest of the LAD is unremarkable. There is 1 small to moderate size diagonal arising within the stented area that has an 80% ostial stenosis.   - Pressors: None  - EKG: Sinus bradycardia  - Trop:   Lab Results   Component Value Date/Time    HIGHSTROPI 3.2 10/17/2024 03:20 PM      - PTA meds: ASA 81 mg, atorvastatin  80 mg daily, Zetia  10 mg daily    10/27 TTE    The LV size, wall thickness and systolic function are normal. EF by Simpson's biplane method is 57%. No segmental wall motion abnormalities.    The RV size is normal. RV systolic function is normal.    Normal biatrial size  No significant valvular disease.    The PA pressure could not be estimated due to inadequate tricuspid regurgitation signal.  Normal estimated central venous pressure.    No pericardial effusion.    No prior for comparison    Plan:  > Cont PTA Aspirin , Atorvastatin , Zetia   > Cont metoprolol  25 mg twice daily  > Cont Heparin  gtt  > Cont Nitro drip for chest pain, titrate to goal of <2/10  > LHC today  > If chest pain and/or back pain are worsening, consider CTA chest to r/o dissection  > Start ACEI/ARB tomorrow   > Consider adding Repatha  for elevated LDL   > Consider adding Imdur  if LHC does not show obstructive lesion      FEN/GI  # No acute issues     Plan:  > Continue to monitor  > Famotidine 20 mg BID for stress ulcer ppx      RENAL  # No acute issues     Lab Results   Component Value Date/Time    CR 0.99 10/18/2024 03:59 AM        Intake/Output Summary (Last 24 hours) at 10/18/2024 0645  Last data filed at 10/18/2024 0408  Gross per 24 hour   Intake --   Output 1300 ml   Net -1300 ml       Plan:  > replace electrolytes PRN  > K >4, Mag >2       ENDOCRINE  #T2DM  Lab Results   Component Value Date/Time    GLUPOC 81 10/17/2024 04:31 PM      - PTA meds: Insulin  (Levemir  30u BID, Humalog 14u with lunch and dinner), metformin  500 mg BID, Jardiance 25 mg qday    Plan:  > Continue to monitor  > Start PTA Insulin  once eating   > hold metformin    > Consider restarting Jardiance after LHC      HEME/ONC  #No acute issues    Recent Labs     10/17/24  1520 10/18/24  0229   HGB 14.7 13.9   MCV 89.7 89.2     Plan:  > Monitor HGB  > Transfuse if Hgb <7 or <8 with active bleeding  > DVT Ppx: SCD's, Heparin       ID  #No acute issues    Recent Labs     10/17/24  1520 10/18/24  0229   WBC 5.30 6.30     - Temp (24hrs), Avg:36.6 ?C (97.8 ?F), Min:36.3 ?C (97.4 ?F), Max:36.7 ?C (98.1 ?F)    Plan:  > Continue to monitor      MSK/DERM  #No acute issues      LDA/PROPHYLAXIS  ICU Lines and Drains       None                 Lines: PIV x2   Tubes: None   Urinary Catheter:  No  Antibiotic Usage:  No  VTE ppx: SCDs, Heparin   GI:  H2RB  Bowel regimen: Pt having regular BMs  Diet: DIET NPO AT MIDNIGHT Sips With Medications  Code status: FULL CODE      Patient seen and discussed with attending physician, Dr. Dorise.    Ozell Bohr, DO  Board-eligible Pediatrician  Emergency Medicine Resident, PGY-2      Subjective     Overnight events: NAEO. Patient reports that her chest pain has resolved. No other complaints    ROS:   Review of Systems   All 12 systems  otherwise negative.        Objective     Scheduled Meds:aspirin  chewable tablet 81 mg, 81 mg, Oral, QDAY  atorvastatin  (LIPITOR) tablet 80 mg, 80 mg, Oral, QDAY  busPIRone (BUSPAR) tablet 5 mg, 5 mg, Oral, BID  duloxetine DR (CYMBALTA) capsule 30 mg, 30 mg, Oral, BID  ezetimibe  (ZETIA ) tablet 10 mg, 10 mg, Oral, QDAY  famotidine (PEPCID) injection 20 mg, 20 mg, Intravenous, BID  [Held by Provider] insulin  aspart (U-100) (NOVOLOG  FLEXPEN U-100 INSULIN ) injection PEN 0-6 Units, 0-6 Units, Subcutaneous, ACHS (22)  insulin  glargine (LANTUS  SOLOSTAR U-100 INSULIN ) injection PEN 20 Units, 20 Units, Subcutaneous, BID  magnesium  sulfate   1 g/D5W 100 mL IVPB, 1 g, Intravenous, Q4H  metoprolol  tartrate tablet 25 mg, 25 mg, Oral, BID  sodium chloride  0.9% IV bolus 500 mL, 500 mL, Intravenous, ONCE    Continuous Infusions:   heparin  (porcine) 25,000 units in dextrose 5% (D5W) 250 mL IV infusion (std conc) 800 Units/hr (10/18/24 0550)    nitroGLYCERIN  50 mg/D5W 250 mL infusion 0.1 mcg/kg/min (10/17/24 1731)     PRN and Respiratory Meds:acetaminophen  Q6H PRN, amitriptyline QHS PRN, dextrose 50% PRN, heparin  (porcine) TITRATE **AND** heparin  (porcine) Q6H PRN                           Vital Signs: Last Filed                 Vital Signs: 24 Hour Range   BP: 153/85 (10/27 0400)  Temp: 36.5 ?C (97.7 ?F) (10/27 0400)  Pulse: 84 (10/27 0400)  Respirations: 18 PER MINUTE (10/27 0400)  SpO2: 97 % (10/27 0400)  O2 Device: None (Room air) (10/27 0400)  Height: 170.2 cm (5' 7) (10/26 1300) BP: (123-153)/(69-85)   Temp:  [36.3 ?C (97.4 ?F)-36.7 ?C (98.1 ?F)]   Pulse:  [57-84]   Respirations:  [16 PER MINUTE-18 PER MINUTE]   SpO2:  [94 %-97 %]   O2 Device: None (Room air)   Intensity Pain Scale (Self Report): 2 (10/17/24 1318) Vitals:    10/17/24 1300 10/18/24 0405   Weight: 120.7 kg (266 lb) 118.8 kg (262 lb)         Physical Exam:  Constitutional: 68 y.o. female AAOx3, resting in bed in no apparent distress  Eyes: Extra-occular muscles intact, PERRL, clear sclera  Cardiovascular: Regular rhythm, normal rate, normal S1 and S2, no murmur noted  Pulmonary: Clear to auscultation bilaterally, no wheezes or rales  GI: Soft, non-tender, non-distended (baseline habitus), bowel sounds throughout  Skin: No rashes or bruises, good turgor, cap refill <2s  Neuro: GCS 15, no focal deficits  Musculoskeletal: Moves all extremities well  Lymphatic/extremities: No pitting edema, pulses present b/l    Artificial Airway   None       Ventilator/Respiratory Therapy  No     Vent Weaning   Not applicable    Laboratory:  Recent Labs     10/17/24  1520 10/18/24  0359   NA 141 140   K 4.4 4.4   CL 105 107   CO2 24 21   GAP 12 12   BUN 14 17   CR 0.94 0.99   GLU 88 124*   CA 9.4 9.2   ALBUMIN 4.1  --    MG 1.8 1.9   TSH 2.01  --        Recent Labs     10/17/24  1520 10/18/24  0229  WBC 5.30 6.30   HGB 14.7 13.9   HCT 44.3 40.7   PLTCT 232 177   PT 12.0  --    INR 1.1  --    PTT 36.0  --    AST 20  --    ALT 14  --    ALKPHOS 111*  --       Estimated Creatinine Clearance: 72.6 mL/min (by C-G formula based on SCr of 0.99 mg/dL).  Vitals:    10/17/24 1300 10/18/24 0405   Weight: 120.7 kg (266 lb) 118.8 kg (262 lb)    No results for input(s): PHART, PO2ART in the last 72 hours.    Invalid input(s): PC02A      Pertinent radiology reviewed.    Malnutrition Details:                                        Active Wounds

## 2024-10-19 ENCOUNTER — Encounter: Admit: 2024-10-19 | Discharge: 2024-10-19 | Payer: MEDICARE

## 2024-10-19 VITALS — BP 130/71 | HR 73 | Temp 97.90000°F | Ht 67.0 in | Wt 261.4 lb

## 2024-10-19 DIAGNOSIS — R001 Bradycardia, unspecified: Secondary | ICD-10-CM

## 2024-10-19 DIAGNOSIS — E66813 Obesity, class 3: Secondary | ICD-10-CM

## 2024-10-19 DIAGNOSIS — Z7902 Long term (current) use of antithrombotics/antiplatelets: Secondary | ICD-10-CM

## 2024-10-19 DIAGNOSIS — I25110 Atherosclerotic heart disease of native coronary artery with unstable angina pectoris: Principal | ICD-10-CM

## 2024-10-19 DIAGNOSIS — Z794 Long term (current) use of insulin: Secondary | ICD-10-CM

## 2024-10-19 DIAGNOSIS — G4733 Obstructive sleep apnea (adult) (pediatric): Secondary | ICD-10-CM

## 2024-10-19 DIAGNOSIS — Z955 Presence of coronary angioplasty implant and graft: Secondary | ICD-10-CM

## 2024-10-19 DIAGNOSIS — I872 Venous insufficiency (chronic) (peripheral): Secondary | ICD-10-CM

## 2024-10-19 DIAGNOSIS — E1165 Type 2 diabetes mellitus with hyperglycemia: Secondary | ICD-10-CM

## 2024-10-19 DIAGNOSIS — Z7984 Long term (current) use of oral hypoglycemic drugs: Secondary | ICD-10-CM

## 2024-10-19 DIAGNOSIS — I10 Essential (primary) hypertension: Secondary | ICD-10-CM

## 2024-10-19 DIAGNOSIS — E785 Hyperlipidemia, unspecified: Secondary | ICD-10-CM

## 2024-10-19 DIAGNOSIS — T82855A Stenosis of coronary artery stent, initial encounter: Secondary | ICD-10-CM

## 2024-10-19 DIAGNOSIS — Z6841 Body Mass Index (BMI) 40.0 and over, adult: Secondary | ICD-10-CM

## 2024-10-19 DIAGNOSIS — Z79899 Other long term (current) drug therapy: Secondary | ICD-10-CM

## 2024-10-19 LAB — ECG 12-LEAD
P AXIS: 74 degrees
P-R INTERVAL: 168 ms
Q-T INTERVAL: 430 ms
QRS DURATION: 76 ms
QTC CALCULATION (BAZETT): 432 ms
R AXIS: 40 degrees
T AXIS: 38 degrees
VENTRICULAR RATE: 61 {beats}/min

## 2024-10-19 LAB — POC GLUCOSE: ~~LOC~~ BKR POC GLUCOSE: 189 mg/dL — ABNORMAL HIGH (ref 70–100)

## 2024-10-19 MED ORDER — METOPROLOL TARTRATE 25 MG PO TAB
12.5 mg | Freq: Two times a day (BID) | ORAL | 0 refills | Status: DC
Start: 2024-10-19 — End: 2024-10-19
  Administered 2024-10-19: 15:00:00 12.5 mg via ORAL

## 2024-10-19 MED ORDER — TICAGRELOR 90 MG PO TAB
90 mg | ORAL_TABLET | Freq: Two times a day (BID) | ORAL | 0 refills | 30.00000 days | Status: DC
Start: 2024-10-19 — End: 2024-10-19
  Filled 2024-10-19: qty 180, 90d supply, fill #0

## 2024-10-19 MED ORDER — MAGNESIUM OXIDE 400 MG (241.3 MG MAGNESIUM) PO TAB
400 mg | Freq: Once | ORAL | 0 refills | Status: CP
Start: 2024-10-19 — End: ?
  Administered 2024-10-19: 11:00:00 400 mg via ORAL

## 2024-10-19 MED ORDER — METOPROLOL TARTRATE 25 MG PO TAB
12.5 mg | ORAL_TABLET | Freq: Two times a day (BID) | ORAL | 0 refills | 90.00000 days | Status: AC
Start: 2024-10-19 — End: ?
  Filled 2024-10-19: qty 90, 90d supply, fill #0

## 2024-10-19 MED ORDER — LOSARTAN 25 MG PO TAB
25 mg | ORAL_TABLET | Freq: Every day | ORAL | 0 refills | 90.00000 days | Status: AC
Start: 2024-10-19 — End: ?
  Filled 2024-10-19: qty 90, 90d supply, fill #0

## 2024-10-19 MED ORDER — REPATHA SURECLICK 140 MG/ML SC PNIJ
140 mg | SUBCUTANEOUS | 11 refills | Status: CN
Start: 2024-10-19 — End: ?

## 2024-10-19 MED ORDER — NITROGLYCERIN 0.4 MG SL SUBL
.4 mg | ORAL_TABLET | SUBLINGUAL | 3 refills | 9.00000 days | Status: AC | PRN
Start: 2024-10-19 — End: ?
  Filled 2024-10-19: qty 25, 8d supply, fill #0

## 2024-10-19 MED ORDER — TICAGRELOR 90 MG PO TAB
90 mg | ORAL_TABLET | Freq: Two times a day (BID) | ORAL | 1 refills | 30.00000 days | Status: AC
Start: 2024-10-19 — End: ?

## 2024-10-19 MED ORDER — LOSARTAN 25 MG PO TAB
25 mg | Freq: Every day | ORAL | 0 refills | Status: DC
Start: 2024-10-19 — End: 2024-10-19
  Administered 2024-10-19: 17:00:00 25 mg via ORAL

## 2024-10-19 NOTE — Progress Notes [1]
 PHYSICAL THERAPY  NOTE      Name: Nicole Pace   MRN: [id number]     DOB: 11-Nov-1956      Age: 68 y.o.  Admission Date: 10/17/2024     LOS: 2 days     Date of Service: 10/19/2024      Per discussion with OT, patient reports no concerns with mobility with no PT goals identified. PT will discontinue service, please re-consult if the patient has a decline in functional status.     Therapist: Gordy Blunt, PT  Date: 10/19/2024

## 2024-10-19 NOTE — Progress Notes [1]
 HC5 END OF SHIFT/PLAN OF CARE NURSING NOTE   Nursing Shift: Night Shift 1900-0700    Acute events, nursing interventions, & communication with providers: At 1945 as TR band was due to be removed, pt began to bleed from the site. TR band placed again soon after, with 5mls of air. At 2008, pt bled again and added of air for 8 total. At 2018, air raised to total d/t bleed. At 2028, air raised to total d/t bleed.     TR band removal protocol restarted at 2110. Was able to remove without complications. Hemostasis achieved at 2248. Protocol followed.     EKG obtained at 0400 as pt had a coronary intervention.       Patient Goal(s)  Patient will Report pain is controlled by the end of next shift.        Patient will  Verbalize readiness for discharge by discharge.   Admission Weight: Weight: 120.7 kg (266 lb)    Last 3 Weights:   Vitals:    10/18/24 0400 10/18/24 0405 10/19/24 0408   Weight: 118.8 kg (262 lb) 118.8 kg (262 lb) 118.6 kg (261 lb 6.4 oz)     Weight Change: Weight trend stable    Intake/Output Summary (Last 24 hours) at 10/19/2024 0612  Last data filed at 10/19/2024 0400  Gross per 24 hour   Intake 815 ml   Output 1100 ml   Net -285 ml     Last Bowel Movement Date:  (pta)    Fluid Restriction? No   Quality/Safety    Total Fall Risk Score: 7   Risk for Injury related to falls: Standard risk for injury based on the ABCS scoring tool  Fall Risk Category:   History of More Than One Fall Within 6 Months Before Admission: No  Elimination, Bowel and Urine: N/A  Interventions: N/A - Does not score for risk in this category  Medications: On 2 or more high fall risk drugs  Interventions: Stay within arm's reach during toileting/showering (i.e., dizziness, orthostasis)   Patient Care Equipment: One present  Interventions: Ensure environment is free of clutter and walkways are clear from tripping hazards  Mobility: 0 - No mobility issues  Interventions: Use of additional staff for handling patient equipment  Cognition: 0 - No cognition issues  Interventions: N/A - Does not score as risk in this category    Other safety precautions in place: N/A    Restraints:  No      Patient Education  This RN provided education to Patient today. The following education topics were reviewed:  Quality/Safety Education:   Pain scale  Medication Education:   Medication management (Indication, adverse effects, monitoring, etc)  Education provided on the following medication(s): buspar, cymbalta  Cardiac - Specific Education:   Heart failure management  General Education:   Diet/nutrition    The following teaching method(s) were used: Verbal  Response to learning: Verbalizes Understanding  Needs reinforcement on: n/a

## 2024-10-19 NOTE — Care Plan [600008]
 Problem: Discharge Planning  Goal: Participation in plan of care  Outcome: Goal Achieved  Goal: Knowledge regarding plan of care  Outcome: Goal Achieved  Goal: Prepared for discharge  Outcome: Goal Achieved     Problem: Glucose Management  Goal: Absence of hyperglycemia  Outcome: Goal Achieved  Goal: Absence of Hypoglycemia  Outcome: Goal Achieved  Goal: Glucose level within specified parameters  Outcome: Goal Achieved     Problem: Moderate Fall Risk  Goal: Moderate Fall Risk  Outcome: Goal Achieved

## 2024-10-19 NOTE — Progress Notes [1]
 10/19/24 0837   Current Cardiac Procedures/Events   Pre Admit Dx Chest Pain   PCI 10/18/24   Risk Factors   Risk Factors Hypertension;Hyperlipidemia;Diabetes-type II   Education   Person Instructed Patient;Spouse   Patient Barriers To Learning None Noted   Interventions/Teaching Methods Verbal Instructions;Written Materials Provided   Patient Response Verbalized Understanding   Topics Risk Factor Management - Physical Inactivity;Home Walking Guidelines;Outpatient Cardiac Rehab Referral Information   Teaching Completed 10/19/24   Heart Resource Manual Given 10/19/24   Comments see notes  (Patient and spouse given information on OPCR. Patient reports understanding. Patient interested in attending [facility] at [facility]. Will fax information today.)   Outpatient Cardiopulmonary Rehab   OPCR Yes   [facility]    [phone]   Date Faxed 10/19/24

## 2024-10-19 NOTE — Progress Notes [1]
 CARDIOPULMONARY REHABILITATION  INPATIENT ASSESSMENT    Cardiac Rehabilitation Staff: Corean Pepper Discharge Date:     Demographics  Pre-admit Dx: Chest Pain Date of Admission: 10/17/2024     Room: HC514/01 DOB:  27-Dec-1955   Insurance: Primary: Medicare  Secondary: U[employer]   Address: [address]  [place] [address]   Patient Phone:  [phone] (home) [phone] (work)       ED Contact: Aughenbaugh,Jim   ED Phone #: [phone]    CTS: N/A Cardiologist: Dorise Gee     Cardiac Procedures and Events         PCI: 10/18/24                   Risk Factors  Risk Factors: Hypertension, Hyperlipidemia, Diabetes-type II  BP: 121/51  Height: 170.2 cm (5' 7)  Weight: 118.6 kg (261 lb 6.4 oz)  BMI (Calculated): 41.03      Medical History   has a past medical history of CAD (coronary artery disease) (10/03/2010), Depression (10/03/2010), Diabetes mellitus type II (10/03/2010), Hyperlipidemia (10/03/2010), and Hypertension (10/03/2010).    Labs  Cholesterol   Date Value Ref Range Status   10/18/2024 145 <200 mg/dL Final     Triglycerides   Date Value Ref Range Status   10/18/2024 121 <150 mg/dL Final     HDL   Date Value Ref Range Status   10/18/2024 40 (L) >40 mg/dL Final     LDL   Date Value Ref Range Status   10/18/2024 98.00 <100.00 mg/dL Final     Hemoglobin J8R   Date Value Ref Range Status   10/18/2024 6.6 (H) 4.0 - 5.7 % Final     Comment:     The ADA recommends that most patients with type 1 and type 2 diabetes maintain an A1c level <7%.     Troponin-I   Date Value Ref Range Status   02/17/2019 0.01 0.0 - 0.05 NG/ML Final         Heart Resource Manual Given: 10/19/24     Teaching Completed: 10/19/24     Outpatient Cardiopulmonary Rehabilitation    Outpatient Cardic Rehab: Yes    [facility]    [phone]   Date Faxed: 10/19/24    [facility]    [phone]    If [place], Sent to Staff:            Corean Pepper, RN  10/19/2024

## 2024-10-19 NOTE — Discharge Summary [5]
 Discharge Summary      Name: Nicole Pace  Medical Record Number: [id number]        Account Number:  [id number]  Date Of Birth:  07-21-1956                         Age:  68 y.o.  Admit date:  10/17/2024                     Discharge date: 10/19/2024      Discharge Attending:  Norman Coder, MD  Discharge Summary Completed By: Ozell Bohr III, DO    Service: Cardiology Service - [id number]    Reason for hospitalization:  Angina pectoris [I20.9]    Primary Discharge Diagnosis:   Chest pain, rule out acute myocardial infarction    Hospital Diagnoses:  Hospital Problems        Active Problems    * (Principal) Chest pain, rule out acute myocardial infarction    CAD (coronary artery disease)    Hypertension    Hyperlipidemia    Type 2 diabetes mellitus with hyperglycemia, with long-term current use   of insulin  (CMS-HCC)    Sleep apnea    Angina pectoris    Status post percutaneous coronary intervention (PCI)    Class 3 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 [facility])     Present on Admission:   CAD (coronary artery disease)   Chest pain, rule out acute myocardial infarction   Angina pectoris   Hyperlipidemia   Hypertension   Sleep apnea   Type 2 diabetes mellitus with hyperglycemia, with long-term current use of insulin  (CMS-HCC)        Significant Past Medical History        CAD (coronary artery disease)  Depression  Diabetes mellitus type II  Hyperlipidemia  Hypertension    Allergies   Imdur  [isosorbide  mononitrate], Nuts, Other [unclassified drug], Artificial sweetner, Flu vaccine [influenza virus vaccines], Morphine, Tessalon [benzonatate], and Effient  [prasugrel ]    Brief Hospital Course   The patient was admitted and the following issues were addressed during this hospitalization: (with pertinent details including admission exam/imaging/labs).      Nicole Pace is a 68 y.o. woman with PMH of venous insufficiency, arthritis, CAD s/p drug-eluting stents (LAD x1 in 2010, LAD x1 in 2020), HTN, HLD, insulin -dependent T2DM, and sleep apnea who was admitted on 10/26 as a transfer from [facility] for unstable angina. Upon admission she was started on Nitroglycerin  gtt which alleviated her chest pain completely. ECG demonstrated sinus bradycardia without ischemic changes. Initial high-sensitivity troponin here was negative. NT proBNP was normal. CBC and CMP were unremarkable. Chest x-ray without any acute abnormalities.   She then underwent LHC on 10/27 and is now s/p PCI with DES x1 (mid left circumflex artery). She was loaded with ticagrelor . After the procedure she continued to be pain free and well-appearing on exam. Recommend continuation of DAPT for minimum of 6 months. Consider P2Y12 monotherapy long-term. Prior authorization for Repatha  sent and will need to be addressed as an outpatient. Patient's LDL during admission was 98 (above goal of 70) and she is already on high-dose statin as well as Zetia .     Day of discharge exam notable for:   Constitutional: 68 y.o. female AAOx3, resting in bed in no apparent distress  Eyes: Extra-occular muscles intact, PERRL, clear sclera  Cardiovascular: Regular rhythm, normal rate, normal S1 and S2, no murmur noted  Pulmonary: Clear to auscultation bilaterally, no  wheezes or rales  GI: Soft, non-tender, non-distended (baseline habitus), bowel sounds throughout  Skin: No rashes or bruises, good turgor, cap refill <2s  Neuro: GCS 15, no focal deficits  Musculoskeletal: Moves all extremities well  Lymphatic/extremities: No pitting edema, pulses present b/l    Items Needing Follow Up   Pending items or areas that need to be addressed at follow up:     - Repatha  prior authorization and prescription    Pending Labs and Follow Up Radiology    Pending labs and/or radiology review at this time of discharge are listed below: Please note- any labs with collected status will not have a result; if this area is blank, there are no items for review.         Medications        Medication List        START taking these medications      losartan 25 mg tablet  Commonly known as: COZAAR  Dose: 25 mg  Take one tablet by mouth daily. Indications: GDMT ACS  For: GDMT ACS  Quantity: 90 tablet  Refills: 0  Start taking on: October 20, 2024     metoprolol  tartrate 25 mg tablet  Dose: 12.5 mg  Take one-half tablet by mouth twice daily. Indications: a sudden worsening of angina called acute coronary syndrome  For: a sudden worsening of angina called acute coronary syndrome  Quantity: 180 tablet  Refills: 0     REPATHA  SURECLICK 140 mg/mL injectable PEN  Generic drug: evolocumab   Dose: 140 mg  Inject 1 mL under the skin every 14 days. Indications: high cholesterol, increased cardiovascular event risk  For: high cholesterol, increased cardiovascular event risk  Quantity: 2 mL  Refills: 11     ticagrelor  90 mg tablet  Commonly known as: BRILINTA   Dose: 90 mg  Take one tablet by mouth twice daily. Indications: blood clot prevention following percutaneous coronary intervention  For: blood clot prevention following percutaneous coronary intervention  Quantity: 180 tablet  Refills: 1            CONTINUE taking these medications      albuterol sulfate 90 mcg/actuation HFA aerosol inhaler  Commonly known as: PROAIR HFA  Dose: 2 puff  Inhale two puffs by mouth into the lungs every 6 hours as needed for Wheezing or Shortness of Breath.  Refills: 0     amitriptyline 25 mg tablet  Commonly known as: ELAVIL  Dose: 25 mg  Take one tablet by mouth at bedtime daily.  Refills: 0     aspirin  EC 81 mg tablet  Commonly known as: ASPIR-LOW  Dose: 81 mg  Take 1 Tab by mouth daily. Take with food.  For diagnoses: Atypical angina  Quantity: 90 Tab  Refills: 3     atorvastatin  80 mg tablet  Commonly known as: LIPITOR  Dose: 80 mg  Take one tablet by mouth daily.  For diagnoses: Essential hypertension, Atypical angina  Quantity: 90 tablet  Refills: 3     BenadryL  25 mg capsule  Generic drug: diphenhydrAMINE  HCL  Dose: 25 mg  Take one capsule by mouth every 6 hours as needed.  Refills: 0     busPIRone 5 mg tablet  Commonly known as: BUSPAR  Dose: 5 mg  Take one tablet by mouth twice daily.  Refills: 0     duloxetine DR 30 mg capsule  Commonly known as: CYMBALTA  Dose: 30 mg  Take one capsule by mouth twice daily.  Refills: 0     empagliflozin 10 mg tablet  Commonly known as: JARDIANCE  Dose: 10 mg  Take one tablet by mouth daily.  Refills: 0     EPINEPHrine 1 mg/mL injection pen (2-Pack)  Commonly known as: EPIPEN  Dose: 0.3 mg  Inject 0.3 mL into the muscle once as needed. Inject 0.3 mg (1 Pen) into thigh if needed for anaphylactic reaction. May repeat in 5-15 minutes if needed.  Refills: 0     ezetimibe  10 mg tablet  Commonly known as: ZETIA   Dose: 10 mg  TAKE ONE TABLET BY MOUTH EVERY DAY  Quantity: 30 tablet  Refills: 11     HumaLOG KwikPen Insulin  200 unit/mL (3 mL) injection PEN  Generic drug: insulin  lispro  Dose: 12 Units  Inject twelve Units under the skin three times daily.  Refills: 0     LANTUS  SOLOSTAR U-100 INSULIN  100 unit/mL (3 mL) subcutaneous PEN  Generic drug: insulin  glargine  Dose: 30 Units  Inject thirty Units under the skin twice daily.  Refills: 0     metFORMIN -XR 500 mg extended release tablet  Commonly known as: GLUCOPHAGE  XR  Dose: 500 mg  Take one tablet by mouth twice daily.  Refills: 0     nitroglycerin  0.4 mg tablet  Commonly known as: NITROSTAT   Dose: 0.4 mg  Place one tablet under tongue every 5 minutes as needed for Chest Pain. Indications: acute attack of angina  For: acute attack of angina  Quantity: 25 tablet  Refills: 3     other medication  Nervive Nerve Relief. Take 1 tablet at bedtime daily.  Refills: 0     PROBIOTIC PO  Dose: 1 tablet  Take 1 tablet by mouth daily.  Refills: 0            STOP taking these medications      AdviL  Dual Action 125-250 mg tablet  Generic drug: ibuprofen -acetaminophen                Where to Get Your Medications        These medications were sent to [facility]  [address],   [place] [address]      Hours: Monday-Friday 6 a.m.-9 p.m. Saturday-Sunday 9 a.m.-5 p.m. Phone: [phone] Phone: [phone]   losartan 25 mg tablet  metoprolol  tartrate 25 mg tablet  nitroglycerin  0.4 mg tablet  REPATHA  SURECLICK 140 mg/mL injectable PEN  ticagrelor  90 mg tablet         Return Appointments and Scheduled Appointments     Scheduled appointments:      Oct 25, 2024 2:00 PM  Office visit with Corean DELENA Harness, PA-C  Cardiovascular Medicine: [facility] [place] (CVM Exam) [address]  [address]  [place] [address]  [phone]     Dec 30, 2024 10:30 AM  Office visit with Elspeth JONETTA Balloon, MD  Cardiovascular Medicine: [facility] (CVM Exam) [address]  [address], [address]  [place] [address]  [phone]            Consults, Procedures, Diagnostics, Micro, Pathology   Consults: Interventional Cardiology  Surgical Procedures & Dates: PCI on 10/27  Significant Diagnostic Studies, Micro and Procedures: noted in brief hospital course  Significant Pathology: noted in brief hospital course                       Discharge Disposition, Condition  Patient Disposition: Home or Self Care [01]  Condition at Discharge: Stable    Code Status   Prior    Patient Instructions     Activity       Activity as Tolerated   As directed      It is important to keep increasing your activity level after you leave the hospital.  Moving around can help prevent blood clots, lung infection (pneumonia) and other problems.  Gradually increasing the number of times you are up moving around will help you return to your normal activity level more quickly.  Continue to increase the number of times you are up to the chair and walking daily to return to your normal activity level. Begin to work towards your normal activity level at discharge.    Strenuous Activity Restrictions   As directed      Please refrain from strenuous activity until after follow-up appointment          Diet       Cardiac Diet   As directed      Limiting unhealthy fats and cholesterol is the most important step you can take in reducing your risk for cardiovascular disease.  Unhealthy fats include saturated and trans fats.  Monitor your sodium and cholesterol intake.  Restrict your sodium to 2g (grams) or 2000mg  (milligrams) daily, and your cholesterol to 200mg  daily.    If you have questions regarding your diet at home, you may contact a dietitian at [phone].             Discharge education provided to patient.    Additional Orders: Case Management, Supplies, Home Health     Home Health/DME       None                Signed:  Ozell Bohr III, DO  10/19/2024      cc:  Primary Care Physician:  Andee Norman LABOR   Verified    Referring physicians:  Unknown, Unknown, MD   Additional provider(s):        Did we miss something? If additional records are needed, please fax a request on office letterhead to [phone]. Please include the patient's name, date of birth, fax number and type of information needed. Additional request can be made by email at [email or web address]. For general questions of information about electronic records sharing, call [phone].

## 2024-10-19 NOTE — Progress Notes [1]
 OCCUPATIONAL THERAPY  ASSESSMENT/DISCHARGE NOTE      Name: Nicole Pace   MRN: [id number]     DOB: 1956/11/02      Age: 68 y.o.  Admission Date: 10/17/2024     LOS: 2 days     Date of Service: 10/19/2024      Mobility  Patient Turn/Position: Supine  Mobility Level [facility] Highest Level of Mobility (JH-HLM): Walk 250 feet or more  Distance Walked (feet): 360 ft  Level of Assistance: Independent  Assistive Device: None  Activity Limited By: No limitations    Subjective  Significant Hospital Events: admitted to [facility] for chest pan, status post PCI  Mental / Cognitive: Alert;Oriented  Pain: Complains of pain  Pain level: 2/10  Pain Location: Post-surgical;Right;Wrist  Persons Present: Nursing Staff    Home Living Situation  Lives With: Spouse/significant other  Type of Home: House  Entry Stairs: No stairs;Ramp  In-Home Stairs: Able to live on main level  Bathroom Setup: Walk in shower  Patient Owned Equipment: Bathing: [employer] with wheels;Cane: Single point    Prior Level of Function  Level Of Independence: Independent with ADL and community mobility without device  Required Assist For: All home functioning ADL  History of Falls in Past 3 Months: No  Comments: previous hip/knee surgeries,  owns walkers/canes does not use    ADL's  Where Assessed: Edge of Bed;Standing at [employer] Assist: Independent  Grooming Deficits: No Assist Needed  LE Dressing Assist: Independent  LE Dressing Deficits: No Assist Needed    ADL Mobility  Bed Mobility: Supine to Sit: Independent  Bed Mobility: Sit to Supine: Independent  Transfer Type: Sit to/from stand  Transfer: Assistance Level: Independent  Transfer: Assistive Device: None  End of Activity Status: In bed;Nursing notified  Sitting Balance: Independent;No UE support  Standing Balance: Independent;No UE support  Gait Distance: 360 feet  Gait: Assistance Level: Independent  Gait: Assistive Device: None    Activity Tolerance  Endurance: 3/5 Tolerates 25-30 Minutes Exercise w/Multiple Rests  Comment: 2/10 RPE following activity    Cognition  Overall Cognitive Status: WFL to Adequately Complete Self Care Tasks Safely    ROM  R UE ROM: WFL   R UE ROM Method: Active  L UE ROM: WFL   L UE ROM Method: Active  R LE ROM: WFL  R LE ROM Method: Active  L LE ROM: WFL  L LE ROM Method: Active    Strength / Tone  Strength Position Assessed: Seated  R UE Strength: WFL  L UE Strength: WFL  R LE Strength: WFL  L LE Strength: WFL    Education  Persons Educated: Patient  Topics: Role of OT, Goals for Therapy;Home safety;ADL Compensatory Techniques;Adaptive Devices for ADLs  Goal Formulation: With Patient    Assessment  No Skilled OT: Independent with ADLs;Safe To Return Home    AM-PAC 6 Clicks Daily Activity Inpatient  Putting on and taking off regular lower body clothes: None  Bathing (Including washing, rinsing, drying): None  Toileting, which includes using toilet, bedpan, or urinal: None  Putting on and taking off regular upper body clothing: None  Taking care of personal grooming such as brushing teeth: None  Eating meals: None  Daily Activity Raw Score: 24  Standardized (T-scale) Score: 57.54    Plan  OT Frequency: No Further Treatment    OT Discharge Recommendations  Recommendation: Home/prior living situation            Therapist:  Wyvonna Kehr, OTR/L [id number]  Date: 10/19/2024

## 2024-10-19 NOTE — Progress Notes [1]
 CICU Progress Note      Name: Nicole Pace        Birthday: 22-Jun-1956                                MRN: [id number]  Admission Date: 10/17/2024                                                LOS: 2 days      Brief Hospital Course     Nicole Pace is a 68 y.o. woman with PMH of venous insufficiency, arthritis, CAD s/p drug-eluting stents (LAD x1 in 2010, LAD x1 in 2020), hypertension, hyperlipidemia, insulin -dependent type 2 diabetes, and sleep apnea admitted as a transfer from [facility] for unstable angina.     Chest pain resolved with Nitroglycerin  gtt. .    Interval update 10/19/2024:  > NAEO  > borderline bradycardia last night so metoprolol  was held   > LHC yesterday 10/27  > likely to be discharged home today    Assessment & Plan     Principal Problem:    Chest pain, rule out acute myocardial infarction  Active Problems:    CAD (coronary artery disease)    Hypertension    Hyperlipidemia    Type 2 diabetes mellitus with hyperglycemia, with long-term current use of insulin  (CMS-HCC)    Sleep apnea    Angina pectoris        NEURO/PSYCH  #Depression  #Insomnia  - PTA meds: Buspar 5 mg twice daily, duloxetine 30 mg twice daily, amitriptyline 25 mg at bedtime as needed  Plan:  > continue PTA Buspar, Duloxetine, and Amitriptyline        PULMONARY  #OSA  - CXR: ordered  Plan:  > Pulm hygeine  > CPAP at night PRN      CARDIOVASCULAR  #CAD s/p PCI x2  #Unstable Angina  #HTN  #HLD  #Venous insufficiency  -05/02/09 Heart Cath: Single vessel disease: LAD (mid) 85% discrete lesion treated with 3.5x20mm Med[employer]onic endeavor sprint DES stent.  Presented with intermittent chest pain and abnormal stress test.  -07/2010:  Abnormal stress thallium  (antero-apical ischemia)  -09/04/10 Heart Cath: Non-obstructive Coronary disease, Mild ISR LAD(mid) tubular 20% lesion, LV EF 65%  -12/2012 Cath: LAD stent patent. No significant obstructive lesions  -02/12/19 Cath: revealed a high-grade in-stent restenosis of the previously-placed stent, extending slightly distal to the stent in the left anterior descending artery, treated with a 3.0 x 15 mm drug-eluting Xience stent.  Mild disease of the left circumflex artery and RCA.  -11/04/22 Cath: The LAD is a type 3 LAD. It has stents in the proximal to midportion. The stents are patent with 30% in-stent restenosis in the proximal stent. The rest of the LAD is unremarkable. There is 1 small to moderate size diagonal arising within the stented area that has an 80% ostial stenosis.   - Pressors: None  - EKG: Sinus bradycardia  - Trop:   Lab Results   Component Value Date/Time    HIGHSTROPI 3.4 10/18/2024 10:01 AM      - PTA meds: ASA 81 mg, atorvastatin  80 mg daily, Zetia  10 mg daily    10/27 TTE    The LV size, wall thickness and systolic function are normal. EF by Simpson's biplane method  is 57%. No segmental wall motion abnormalities.    The RV size is normal. RV systolic function is normal.    Normal biatrial size    No significant valvular disease.    The PA pressure could not be estimated due to inadequate tricuspid regurgitation signal.  Normal estimated central venous pressure.    No pericardial effusion.    No prior for comparison    Plan:  > Cont PTA Aspirin , Atorvastatin , Zetia   > Cont metoprolol  25 mg twice daily  > Brilinta  90 mg BID  > Start Valsartan today   > F/u OP about Repatha  (prior authorization expired but re-engaged while inpatient)  > DAPT for 6 months; consider P2Y12 monotherapy after      FEN/GI  # No acute issues     Plan:  > Continue to monitor  > Famotidine 20 mg BID for stress ulcer ppx      RENAL  # No acute issues     Lab Results   Component Value Date/Time    CR 1.00 10/19/2024 04:14 AM        Intake/Output Summary (Last 24 hours) at 10/19/2024 0609  Last data filed at 10/19/2024 0400  Gross per 24 hour   Intake 815 ml   Output 1100 ml   Net -285 ml       Plan:  > replace electrolytes PRN  > K >4, Mag >2       ENDOCRINE  #T2DM  Lab Results   Component Value Date/Time    GLUPOC 189 (H) 10/18/2024 08:28 PM      - PTA meds: Insulin  (Levemir  30u BID, Humalog 14u with lunch and dinner), metformin  500 mg BID, Jardiance 25 mg qday    Plan:  > Continue to monitor  > Start PTA Insulin  once eating   > hold metformin    > Consider restarting Jardiance after LHC      HEME/ONC  #No acute issues    Recent Labs     10/17/24  1520 10/18/24  0229 10/19/24  0414   HGB 14.7 13.9 13.3   MCV 89.7 89.2 90.4     Plan:  > Monitor HGB  > Transfuse if Hgb <7 or <8 with active bleeding  > DVT Ppx: SCD's, Heparin       ID  #No acute issues    Recent Labs     10/17/24  1520 10/18/24  0229 10/19/24  0414   WBC 5.30 6.30 6.80     - Temp (24hrs), Avg:36.4 ?C (97.6 ?F), Min:36.3 ?C (97.4 ?F), Max:36.6 ?C (97.8 ?F)    Plan:  > Continue to monitor      MSK/DERM  #No acute issues      LDA/PROPHYLAXIS  ICU Lines and Drains       None                 Lines: PIV x2   Tubes: None   Urinary Catheter:  No  Antibiotic Usage:  No  VTE ppx: SCDs, Heparin   GI:  H2RB  Bowel regimen: Pt having regular BMs  Diet: DIET CARDIAC(LOW FAT/LOW SODIUM)  Code status: FULL CODE      Patient seen and discussed with attending physician, Dr. Dorise.    Ozell Bohr, DO  Board-eligible Pediatrician  Emergency Medicine Resident, PGY-2      Subjective     Overnight events: NAEO. Patient reports that her chest pain has resolved. No other complaints    ROS:   Review  of Systems   All 12 systems otherwise negative.        Objective     Scheduled Meds:aspirin  chewable tablet 81 mg, 81 mg, Oral, QDAY  atorvastatin  (LIPITOR) tablet 80 mg, 80 mg, Oral, QDAY  busPIRone (BUSPAR) tablet 5 mg, 5 mg, Oral, BID  duloxetine DR (CYMBALTA) capsule 30 mg, 30 mg, Oral, BID  ezetimibe  (ZETIA ) tablet 10 mg, 10 mg, Oral, QDAY  famotidine (PEPCID) injection 20 mg, 20 mg, Intravenous, BID  [Held by Provider] insulin  aspart (U-100) (NOVOLOG  FLEXPEN U-100 INSULIN ) injection PEN 0-6 Units, 0-6 Units, Subcutaneous, ACHS (22)  insulin  glargine (LANTUS  SOLOSTAR U-100 INSULIN ) injection PEN 20 Units, 20 Units, Subcutaneous, BID  metoprolol  tartrate tablet 12.5 mg, 12.5 mg, Oral, BID  ticagrelor  (BRILINTA ) tablet 90 mg, 90 mg, Oral, BID    Continuous Infusions:      PRN and Respiratory Meds:acetaminophen  Q6H PRN, amitriptyline QHS PRN, dextrose 50% PRN                           Vital Signs: Last Filed                 Vital Signs: 24 Hour Range   BP: 116/51 (10/28 0354)  Temp: 36.5 ?C (97.7 ?F) (10/28 0354)  Pulse: 67 (10/28 0354)  Respirations: 18 PER MINUTE (10/28 0354)  SpO2: 97 % (10/28 0354)  O2 Device: None (Room air) (10/28 0354) BP: (87-198)/(51-94)   Temp:  [36.3 ?C (97.4 ?F)-36.6 ?C (97.8 ?F)]   Pulse:  [48-92]   Respirations:  [18 PER MINUTE]   SpO2:  [91 %-100 %]   O2 Device: None (Room air)   Intensity Pain Scale (Self Report): 6 (10/18/24 1900) Vitals:    10/18/24 0400 10/18/24 0405 10/19/24 0408   Weight: 118.8 kg (262 lb) 118.8 kg (262 lb) 118.6 kg (261 lb 6.4 oz)         Physical Exam:  Constitutional: 68 y.o. female AAOx3, resting in bed in no apparent distress  Eyes: Extra-occular muscles intact, PERRL, clear sclera  Cardiovascular: Regular rhythm, normal rate, normal S1 and S2, no murmur noted  Pulmonary: Clear to auscultation bilaterally, no wheezes or rales  GI: Soft, non-tender, non-distended (baseline habitus), bowel sounds throughout  Skin: No rashes or bruises, good turgor, cap refill <2s  Neuro: GCS 15, no focal deficits  Musculoskeletal: Moves all extremities well  Lymphatic/extremities: No pitting edema, pulses present b/l    Artificial Airway   None       Ventilator/Respiratory Therapy  No     Vent Weaning   Not applicable    Laboratory:  Recent Labs     10/17/24  1520 10/18/24  0229 10/18/24  0359 10/19/24  0414   NA 141  --  140 139   K 4.4  --  4.4 4.0   CL 105  --  107 105   CO2 24  --  21 24   GAP 12  --  12 10   BUN 14  --  17 18   CR 0.94  --  0.99 1.00   GLU 88  --  124* 185*   CA 9.4  --  9.2 9.0   ALBUMIN 4.1  --   --   --    MG 1.8  --  1.9 1.7 HGBA1C  --  6.6*  --   --    TSH 2.01  --   --   --  Recent Labs     10/17/24  1520 10/18/24  0229 10/19/24  0414   WBC 5.30 6.30 6.80   HGB 14.7 13.9 13.3   HCT 44.3 40.7 39.6   PLTCT 232 177 219   PT 12.0  --   --    INR 1.1  --   --    PTT 36.0  --   --    AST 20  --   --    ALT 14  --   --    ALKPHOS 111*  --   --       Estimated Creatinine Clearance: 71.7 mL/min (by C-G formula based on SCr of 1 mg/dL).  Vitals:    10/18/24 0400 10/18/24 0405 10/19/24 0408   Weight: 118.8 kg (262 lb) 118.8 kg (262 lb) 118.6 kg (261 lb 6.4 oz)    No results for input(s): PHART, PO2ART in the last 72 hours.    Invalid input(s): PC02A      Pertinent radiology reviewed.    Malnutrition Details:                                        Active Wounds

## 2024-10-20 ENCOUNTER — Encounter: Admit: 2024-10-20 | Discharge: 2024-10-20 | Payer: MEDICARE

## 2024-10-21 ENCOUNTER — Encounter: Admit: 2024-10-21 | Discharge: 2024-10-21 | Payer: MEDICARE

## 2024-10-21 NOTE — Progress Notes [1]
 Chaplain Patent attorney S visited patient for emotional/spiritual support. If significant need or distress was identified, staff chaplain will follow up as appropriate.    The On-Call Chaplain is available on Voalte or can be paged via the switchboard [phone]) for urgent and emergent needs.   The Spiritual Care team responds to other requests within 24-hours when submitted as a Chaplain Consult in O2.

## 2024-10-21 NOTE — Progress Notes [1]
 Pharmacy Benefits Investigation    Medication name: evolocumab  (REPATHA  SURECLICK) 140 mg/mL injectable PEN  Medication status: new    The insurance requires a prior authorization for the medication. The prior authorization was submitted via CoverMyMeds.    PA number: [id number]      Monico Oris  Specialty Pharmacy Patient Advocate

## 2024-10-25 ENCOUNTER — Encounter: Admit: 2024-10-25 | Discharge: 2024-10-25 | Payer: MEDICARE

## 2024-10-25 ENCOUNTER — Ambulatory Visit: Admit: 2024-10-25 | Discharge: 2024-10-26 | Payer: MEDICARE

## 2024-10-25 VITALS — BP 130/70 | HR 77 | Ht 67.0 in | Wt 270.0 lb

## 2024-10-25 DIAGNOSIS — I10 Essential (primary) hypertension: Secondary | ICD-10-CM

## 2024-10-25 DIAGNOSIS — Z9861 Coronary angioplasty status: Secondary | ICD-10-CM

## 2024-10-25 DIAGNOSIS — R0989 Other specified symptoms and signs involving the circulatory and respiratory systems: Principal | ICD-10-CM

## 2024-10-25 DIAGNOSIS — E7800 Pure hypercholesterolemia, unspecified: Secondary | ICD-10-CM

## 2024-10-25 DIAGNOSIS — I2510 Atherosclerotic heart disease of native coronary artery without angina pectoris: Secondary | ICD-10-CM

## 2024-10-25 MED ORDER — METOPROLOL TARTRATE 25 MG PO TAB
25 mg | ORAL_TABLET | Freq: Two times a day (BID) | ORAL | 0 refills | 90.00000 days | Status: AC
Start: 2024-10-25 — End: ?

## 2024-10-25 NOTE — Progress Notes [1]
 Date of Service: 10/25/2024    Nicole Pace is a 68 y.o. female.       HPI     I had the pleasure of seeing Nicole Pace today for a follow up appointment. She has venous insufficiency, arthritis, CAD s/p drug-eluting stents (LAD x1 in 2010, LAD x1 in 2020), HTN, HLD, insulin -dependent T2DM, and sleep apnea on CPAP. She was admitted to [facility] on 10/26 as a transfer from [facility] for unstable angina.  She was started on a nitroglycerin  drip and her chest pain resolved.  Her EKG revealed sinus bradycardia with no ischemic changes.  Troponins and NTpro BNP were normal. She underwent a cardiac catheterization on 10/18/2024 which revealed the mid LAD had a previously deployed stent which was widely patent with minimal in-stent restenosis. The LAD stent does jail a diagonal branch that is moderate in size, has an ostial 70% stenosis, unchanged from 2023. The mid circumflex had an 80% stenosis, with borderline RFR, but since her symptoms were consistent with unstable angina, the lesion was treated with a 3.0 x 15 mm Xience drug eluting stent. The prox and mid RCA has mild to moderate stenosis. She was loaded with Brilinta . An echocardiogram revealed LVEF 57% with no wall motion abnormalities, no significant valve disease. She was also started on losartan, metoprolol  tartrate.    Today, she reports since she was discharged, she is still noting some chest discomfort most nights, when at rest, when she starts thinking about heart issues or other things that stress her out. She has taken some nitroglycerin  which seems to make the chest pain resolve but then she gets a headache. She was in [place] this past weekend, visiting her granddaughter, and did quite a bit of walking ~2 blocks with no chest discomfort. She does note some dyspnea with exertion. When she first got home, she was noting some dyspnea when lying on her stomach like she usually does. She has therefore been elevating the head of the bed, as if she was sleeping on ~3 pillows, but she has not noted significant edema.           Vitals:    10/25/24 1346   BP: 130/70   BP Source: Arm, Left Upper   Pulse: 77   SpO2: 97%   O2 Device: None (Room air)   PainSc: Zero   Weight: 122.5 kg (270 lb)   Height: 170.2 cm (5' 7)     Body mass index is 42.29 kg/m?Nicole Pace     Past Medical History  Patient Active Problem List    Diagnosis Date Noted    Status post percutaneous coronary intervention (PCI) 10/19/2024    Class 3 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 [facility]) 10/19/2024    Angina pectoris 10/17/2024    2019 novel coronavirus vaccination declined 09/07/2020    Right middle trigger finger 04/10/2020    Headache 02/20/2019    Lightheadedness 02/20/2019    Venous insufficiency 04/08/2013    Chest pain, rule out acute myocardial infarction 12/31/2012    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  Presented with intermittent chest pain and abnormal stress test.  07/2010  Abnormal stress thallium  (antero-apical ischemia)  09/04/10 Heart Cath:  Non-obstructive Coronary disease, Mild ISR LAD(mid) tubular 20% lesion, LV EF 65%  12/2012 Cath:  LAD stent patent.  No  significant obstructive lesions  02/12/19-cardiac catheterization revealed a high-grade in-stent restenosis of the previously-placed stent, extending slightly distal to the stent in the left anterior descending artery, treated with a 3.0 x 15 mm drug-eluting Xience stent.  Mild disease of the left circumflex artery and RCA.      Hypertension 10/03/2010    Hyperlipidemia 10/03/2010    Type 2 diabetes mellitus with hyperglycemia, with long-term current use of insulin  (CMS-HCC) 10/03/2010    Depression 10/03/2010    Sleep apnea 10/03/2010     Sleep study 09/13/09 Recommendations CPAP with 14cm           Review of Systems   Constitutional: Negative.   HENT: Negative.     Eyes: Negative.    Cardiovascular:  Positive for chest pain.        Patient states she has chest pain in the middle to upper left side of her chest. She states the pain can get to a 7 out of 10 but taking her nitroglycerin  helps.   Respiratory:  Positive for shortness of breath.    Endocrine: Negative.    Hematologic/Lymphatic: Negative.    Skin: Negative.    Musculoskeletal: Negative.    Gastrointestinal: Negative.    Genitourinary: Negative.    Neurological: Negative.    Psychiatric/Behavioral: Negative.     Allergic/Immunologic: Negative.        Physical Exam  Gen: appears stated age, in no acute distress  Head: normocephalic, atraumatic  Eyes: sclera non-icteric, EOMs intact   Mouth: mucous membranes are moderately moist  Neck: no JVD, no carotid bruits auscultated  Lungs: CTA bilaterally without rales or rhonchi  Heart: RRR without murmur or gallop appreciated  Abdomen: soft, nontender, bowel sounds present  Extremities: Right wrist is ecchymotic without swelling/hematoma; Right radial and ulnar pulses intact with good capillary refill. No lower extremity edema, pedal pulses are intact.   Skin: warm and dry  Neurological: A&Ox3, no focal deficits noted  Psychiatric: calm, pleasant, and cooperative    EKG today-preliminary review reveals sinus rhythm, rate 66.       Cardiovascular Health Factors  Vitals BP Readings from Last 3 Encounters:   10/25/24 130/70   10/19/24 130/71   11/13/23 118/87     Wt Readings from Last 3 Encounters:   10/25/24 122.5 kg (270 lb)   10/19/24 118.6 kg (261 lb 6.4 oz)   11/13/23 112.9 kg (248 lb 12.8 oz)     BMI Readings from Last 3 Encounters:   10/25/24 42.29 kg/m?   10/19/24 40.94 kg/m?   11/13/23 38.97 kg/m?      Smoking Tobacco Use History[1]   Lipid Profile Cholesterol   Date Value Ref Range Status   10/18/2024 145 <200 mg/dL Final     HDL   Date Value Ref Range Status   10/18/2024 40 (L) >40 mg/dL Final     LDL   Date Value Ref Range Status   10/18/2024 98.00 <100.00 mg/dL Final Triglycerides   Date Value Ref Range Status   10/18/2024 121 <150 mg/dL Final      Blood Sugar Hemoglobin A1C   Date Value Ref Range Status   10/18/2024 6.6 (H) 4.0 - 5.7 % Final     Comment:     The ADA recommends that most patients with type 1 and type 2 diabetes maintain an A1c level <7%.     Glucose   Date Value Ref Range Status   10/19/2024 185 (H) 70 - 100 mg/dL Final  10/18/2024 124 (H) 70 - 100 mg/dL Final   89/73/7974 88 70 - 100 mg/dL Final     Glucose, POC   Date Value Ref Range Status   10/18/2024 189 (H) 70 - 100 mg/dL Final   89/73/7974 81 70 - 100 mg/dL Final   88/86/7976 824 (H) 70 - 100 MG/DL Final          Problems Addressed Today  Encounter Diagnoses   Name Primary?    Coronary artery disease due to lipid rich plaque Yes    Cardiovascular symptoms     Status post percutaneous coronary intervention (PCI)     Primary hypertension     Pure hypercholesterolemia        Assessment and Plan       Coronary artery disease  She underwent stenting of the circumflex artery a week ago. She is still noting some chest discomfort at rest. Her EKG is normal but she reports it is always normal, even when she is having a cardiac issue. Her heart cath did not reveal any other flow limiting disease that should be causing symptoms but she could have microvascular disease. She reports compliance with taking DAPT daily. She has not tolerated Imdur  in the past. I recommended she increase metoprolol  from 12.5mg  to 25mg  BID. If she continues to have chest discomfort, we can add Ranexa  which she reports was helpful in the past. We will check in with her in a couple of weeks. She plans to start cardiac rehab next week which I have encouraged. Continue risk factor modification. She has a follow-up appointment with Dr. Quin, her primary cardiologist, in January 2026.    Hyperlipidemia  Her LDL last month was 98 on atorvastatin  80 mg daily and Zetia .  A prescription was sent for Repatha , currently with prior authorization pending    Hypertension  Her BP is borderline today. As noted, I increased the metoprolol  dose.     Diabetes type 2  Her HgbA1c was 6.6 last month.         Current Medications (including today's revisions)   albuterol sulfate (PROAIR HFA) 90 mcg/actuation HFA aerosol inhaler Inhale two puffs by mouth into the lungs every 6 hours as needed for Wheezing or Shortness of Breath.    amitriptyline (ELAVIL) 25 mg tablet Take one tablet by mouth at bedtime daily.    aspirin  EC 81 mg tablet Take 1 Tab by mouth daily. Take with food.    atorvastatin  (LIPITOR) 80 mg tablet Take one tablet by mouth daily.    busPIRone (BUSPAR) 5 mg tablet Take one tablet by mouth twice daily.    diphenhydrAMINE  (BENADRYL ) 25 mg PO capsule Take one capsule by mouth every 6 hours as needed.    duloxetine DR (CYMBALTA) 30 mg capsule Take one capsule by mouth twice daily.    empagliflozin (JARDIANCE) 10 mg tablet Take one tablet by mouth daily.    EPINEPHrine (EPIPEN) 1 mg/mL injection pen (2-Pack) Inject 0.3 mL into the muscle once as needed. Inject 0.3 mg (1 Pen) into thigh if needed for anaphylactic reaction. May repeat in 5-15 minutes if needed.    evolocumab  (REPATHA  SURECLICK) 140 mg/mL injectable PEN Inject 1 mL under the skin every 14 days. Indications: high cholesterol, increased cardiovascular event risk    ezetimibe  (ZETIA ) 10 mg tablet TAKE ONE TABLET BY MOUTH EVERY DAY    insulin  lispro (HUMALOG KWIKPEN INSULIN ) 200 unit/mL (3 mL) injection PEN Inject twelve Units under the skin three times daily.  Lactobacillus acidophilus (PROBIOTIC PO) Take 1 tablet by mouth daily.    LANTUS  SOLOSTAR U-100 INSULIN  100 unit/mL (3 mL) subcutaneous PEN Inject thirty Units under the skin twice daily.    losartan (COZAAR) 25 mg tablet Take one tablet by mouth daily. Indications: GDMT ACS    metFORMIN -XR (GLUCOPHAGE  XR) 500 mg extended release tablet Take one tablet by mouth twice daily.    metoprolol  tartrate 25 mg tablet Take one tablet by mouth twice daily. Indications: a sudden worsening of angina called acute coronary syndrome    nitroglycerin  (NITROSTAT ) 0.4 mg tablet Place one tablet under tongue every 5 minutes as needed for Chest Pain. Indications: acute attack of angina    other medication Nervive Nerve Relief. Take 1 tablet at bedtime daily.    ticagrelor  (BRILINTA ) 90 mg tablet Take one tablet by mouth twice daily. Indications: blood clot prevention following percutaneous coronary intervention                 [1]   Social History  Tobacco Use   Smoking Status Never   Smokeless Tobacco Never

## 2024-10-26 ENCOUNTER — Encounter: Admit: 2024-10-26 | Discharge: 2024-10-26 | Payer: MEDICARE

## 2024-10-26 NOTE — Progress Notes [1]
 Pharmacy Benefits Investigation    Medication name: evolocumab  (REPATHA  SURECLICK) 140 mg/mL injectable PEN  Medication status: new    The prior authorization was approved for Nicole Pace (PA number [id number]) from 10/26/2024 through 10/26/2025.    The out of pocket cost today is $121.06 for 28 days. This cost may change due to factors including but not limited to changes in insurance coverage.    A grant is available through [employer]. A grant was obtained and will provide the patient with $2500 per 12 months.    After assistance, the final out of pocket cost is $0.    The copay is affordable.    The medication cannot be delivered to the clinic until the patient has an appointment scheduled. Contacted the patient to instruct them to contact the clinic to schedule an appointment. Left voicemail asking for a return call to the specialty pharmacy at [phone].      [place]  Specialty Pharmacy Patient Advocate

## 2024-11-01 ENCOUNTER — Encounter: Admit: 2024-11-01 | Discharge: 2024-11-01 | Payer: MEDICARE

## 2024-11-09 ENCOUNTER — Encounter: Admit: 2024-11-09 | Discharge: 2024-11-09 | Payer: MEDICARE

## 2024-11-09 MED ORDER — RANOLAZINE 500 MG PO TB12
500 mg | ORAL_TABLET | Freq: Two times a day (BID) | ORAL | 3 refills | 30.00000 days | Status: AC
Start: 2024-11-09 — End: ?

## 2024-11-09 NOTE — Telephone Encounter [36]
 Patient called today stating that she was asked to call back and report how she was doing after changes in her medication.  Her Lopressor  was increased to 25mg  bid.  She is still having chest pain but not continually.  At times she has it and will take nitro which makes  it will go away. Other times it is not as bad and she just kinda waits it out.

## 2024-11-19 ENCOUNTER — Encounter: Admit: 2024-11-19 | Discharge: 2024-11-19 | Payer: MEDICARE

## 2024-12-13 ENCOUNTER — Encounter: Admit: 2024-12-13 | Discharge: 2024-12-13 | Payer: MEDICARE

## 2024-12-30 ENCOUNTER — Encounter: Admit: 2024-12-30 | Discharge: 2024-12-30 | Payer: MEDICARE

## 2024-12-30 VITALS — BP 121/74 | HR 72 | Ht 67.0 in | Wt 274.8 lb

## 2024-12-30 DIAGNOSIS — I25118 Atherosclerotic heart disease of native coronary artery with other forms of angina pectoris: Principal | ICD-10-CM

## 2024-12-30 DIAGNOSIS — E66813 Class 3 severe obesity with serious comorbidity and body mass index (BMI) of 40.0 to 44.9 in adult, unspecified obesity type (CMS-HCC): Secondary | ICD-10-CM

## 2024-12-30 DIAGNOSIS — E7800 Pure hypercholesterolemia, unspecified: Secondary | ICD-10-CM

## 2024-12-30 DIAGNOSIS — E1165 Type 2 diabetes mellitus with hyperglycemia: Secondary | ICD-10-CM

## 2024-12-30 MED ORDER — RANOLAZINE 1,000 MG PO TB12
1000 mg | ORAL_TABLET | Freq: Two times a day (BID) | ORAL | 3 refills | 30.00000 days | Status: AC
Start: 2024-12-30 — End: ?

## 2024-12-30 MED ORDER — REPATHA SURECLICK 140 MG/ML SC PNIJ
140 mg | SUBCUTANEOUS | 11 refills | 28.00000 days | Status: AC
Start: 2024-12-30 — End: ?

## 2024-12-30 NOTE — Patient Instructions [37]
 We will start the prior authorization for Repatha .  Increase Ranexa  to 1,000 mg twice daily--Rx sent.

## 2024-12-30 NOTE — Assessment & Plan Note [38]
 She's going to talk to her endocrinologist about Ozempic or other similar medication.  She really needs to lose weight!

## 2024-12-30 NOTE — Assessment & Plan Note [38]
 Recent coronary stent procedure at [facility] 09/2024.  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  We'll increase Ranexa  to 1,000 BID.  We also need to push her LDL down into the 50's to help as well.    She'll remain on Brilinta  until April, 2026.

## 2024-12-30 NOTE — Assessment & Plan Note [38]
 Lab Results   Component Value Date    CHOL 145 10/18/2024    TRIG 121 10/18/2024    HDL 40 (L) 10/18/2024    LDL 98.00 10/18/2024    VLDL 24.2 10/18/2024    NONHDLCHOL 105 10/18/2024    CHOLHDLC 5 11/13/2023      With her recent ACS the goal for her LDL should be < 55.  She's on maximally tolerated oral therapy (atorva 80 & Zetia  10 daily).

## 2024-12-30 NOTE — Progress Notes [1]
 Date of Service: 12/30/2024    Nicole Pace is a 69 y.o. female.       HPI     Nicole Pace was in the [place] clinic today for follow-up regarding coronary disease.  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      She had an ACS last October and underwent additional stenting.  She still has some angina symptoms, both at rest, nocturnal, or with exertion.     She's still morbidly obese and is interested in weight-loss medication.        Objective   Vitals:    12/30/24 1035   BP: 121/74   BP Source: Arm, Right Upper   Pulse: 72   SpO2: 97%   O2 Device: None (Room air)   PainSc: Zero   Weight: 124.6 kg (274 lb 12.8 oz)   Height: 170.2 cm (5' 7)     Body mass index is 43.04 kg/m?Nicole Pace     Past Medical History  Patient Active Problem List    Diagnosis Date Noted    Status post percutaneous coronary intervention (PCI) 10/19/2024    Class 3 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 [facility]) 10/19/2024    Angina pectoris 10/17/2024    2019 novel coronavirus vaccination declined 09/07/2020    Right middle trigger finger 04/10/2020    Headache 02/20/2019    Lightheadedness 02/20/2019    Venous insufficiency 04/08/2013    Chest pain, rule out acute myocardial infarction 12/31/2012    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  Presented with intermittent chest pain and abnormal stress test.  07/2010  Abnormal stress thallium  (antero-apical ischemia)  09/04/10 Heart Cath:  Non-obstructive Coronary disease, Mild ISR LAD(mid) tubular 20% lesion, LV EF 65%  12/2012 Cath:  LAD stent patent.  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  Mild disease of the left circumflex artery and RCA.      Hypertension 10/03/2010    Hyperlipidemia 10/03/2010    Type 2 diabetes mellitus with hyperglycemia, with long-term current use of insulin  (CMS-HCC) 10/03/2010    Depression 10/03/2010    Sleep apnea 10/03/2010     Sleep study 09/13/09 Recommendations CPAP with 14cm         Review of Systems   Constitutional: Negative.   HENT: Negative.     Eyes: Negative.    Cardiovascular:  Positive for chest pain (off and on) and dyspnea on exertion.   Respiratory:  Positive for shortness of breath.    Endocrine: Negative.    Hematologic/Lymphatic: Negative.    Skin: Negative.    Musculoskeletal: Negative.    Gastrointestinal: Negative.    Genitourinary: Negative.    Neurological: Negative.    Psychiatric/Behavioral: Negative.     Allergic/Immunologic: Negative.        Physical Exam    Physical Exam   General Appearance: no distress   Skin: warm, no ulcers or xanthomas   Digits and Nails: no cyanosis or clubbing   Eyes: conjunctivae and lids normal, pupils are equal and round   Teeth/Gums/Palate: dentition unremarkable, no lesions   Lips & Oral Mucosa:  no pallor or cyanosis   Neck Veins: normal JVP , neck veins are not distended   Thyroid: no nodules, masses, tenderness or enlargement   Chest Inspection: chest is normal in appearance   Respiratory Effort: breathing comfortably, no respiratory distress   Auscultation/Percussion: lungs clear to auscultation, no rales or rhonchi, no wheezing   PMI: PMI not enlarged or displaced   Cardiac Rhythm: regular rhythm and normal rate   Cardiac Auscultation: S1, S2 normal, no rub, no gallop   Murmurs: no murmur   Peripheral Circulation: normal peripheral circulation   Carotid Arteries: normal carotid upstroke bilaterally, no bruits   Radial Arteries: normal symmetric radial pulses   Abdominal Aorta: no abdominal aortic bruit   Pedal Pulses: normal symmetric pedal pulses   Lower Extremity Edema: no lower extremity edema   Abdominal Exam: soft, non-tender, no masses, bowel sounds normal   Liver & Spleen: no organomegaly   Gait & Station: walks without assistance   Muscle Strength: normal muscle tone   Orientation: oriented to time, place and person   Affect & Mood: appropriate and sustained affect   Language and Memory: patient responsive and seems to comprehend information   Neurologic Exam: neurological assessment grossly intact   Other: moves all extremities      Cardiovascular Health Factors  Vitals BP Readings from Last 3 Encounters:   12/30/24 121/74   10/25/24 130/70   10/19/24 130/71     Wt Readings from Last 3 Encounters:   12/30/24 124.6 kg (274 lb 12.8 oz)   10/25/24 122.5 kg (270 lb)   10/19/24 118.6 kg (261 lb 6.4 oz)     BMI Readings from Last 3 Encounters:   12/30/24 43.04 kg/m?   10/25/24 42.29 kg/m?   10/19/24 40.94 kg/m?      Smoking Tobacco Use History[1]   Lipid Profile Cholesterol   Date Value Ref Range Status   10/18/2024 145 <200 mg/dL Final     HDL   Date Value Ref Range Status   10/18/2024 40 (L) >40 mg/dL Final     LDL   Date Value Ref Range Status   10/18/2024 98.00 <100.00 mg/dL Final     Triglycerides   Date Value Ref Range Status   10/18/2024 121 <150 mg/dL Final      Blood Sugar Hemoglobin A1C   Date Value Ref Range Status   10/18/2024 6.6 (H) 4.0 - 5.7 % Final     Comment:     The ADA recommends that most patients with type 1 and type 2 diabetes maintain an A1c level <7%.     Glucose   Date Value Ref Range Status   10/19/2024 185 (H) 70 - 100 mg/dL Final   89/72/7974 875 (H) 70 - 100 mg/dL Final   89/73/7974 88 70 - 100 mg/dL Final     Glucose, POC   Date Value Ref Range Status   10/18/2024 189 (H) 70 - 100 mg/dL Final   89/73/7974 81 70 - 100 mg/dL Final   88/86/7976 824 (H) 70 - 100 MG/DL Final         Problems Addressed Today  Encounter Diagnoses   Name Primary?    Coronary artery disease of native artery of native heart with stable angina pectoris Yes    Pure hypercholesterolemia Type 2 diabetes mellitus with hyperglycemia, with long-term current use of insulin  (CMS-HCC)        Assessment and Plan       CAD (coronary artery disease)  Recent coronary stent procedure at [facility] 09/2024.  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  We'll increase Ranexa  to 1,000 BID.  We also need to push her LDL down into the 50's to help as well.    She'll remain on Brilinta  until April, 2026.    Hyperlipidemia  Lab Results   Component Value Date    CHOL 145 10/18/2024    TRIG 121 10/18/2024    HDL 40 (L) 10/18/2024    LDL 98.00 10/18/2024    VLDL 24.2 10/18/2024    NONHDLCHOL 105 10/18/2024    CHOLHDLC 5 11/13/2023      With her recent ACS the goal for her LDL should be < 55.  She's on maximally tolerated oral therapy (atorva 80 & Zetia  10 daily).    Type 2 diabetes mellitus with hyperglycemia, with long-term current use of insulin  (CMS-HCC)  She's going to talk to her endocrinologist about Ozempic or other similar medication.  She really needs to lose weight!      Current Medications (including today's revisions)   albuterol sulfate (PROAIR HFA) 90 mcg/actuation HFA aerosol inhaler Inhale two puffs by mouth into the lungs every 6 hours as needed for Wheezing or Shortness of Breath.    amitriptyline  (ELAVIL ) 25 mg tablet Take one tablet by mouth at bedtime daily.    aspirin  EC 81 mg tablet Take 1 Tab by mouth daily. Take with food.    atorvastatin  (LIPITOR) 80 mg tablet Take one tablet by mouth daily.    busPIRone  (BUSPAR ) 5 mg tablet Take one tablet by mouth twice daily.    diphenhydrAMINE  (BENADRYL ) 25 mg PO capsule Take one capsule by mouth every 6 hours as needed.    duloxetine  DR (CYMBALTA ) 30 mg capsule Take one capsule by mouth twice daily.    empagliflozin (JARDIANCE) 10 mg tablet Take one tablet by mouth daily.    EPINEPHrine (EPIPEN) 1 mg/mL injection pen (2-Pack) Inject 0.3 mL into the muscle once as needed. Inject 0.3 mg (1 Pen) into thigh if needed for anaphylactic reaction. May repeat in 5-15 minutes if needed.    evolocumab  (REPATHA  SURECLICK) 140 mg/mL injectable PEN Inject 1 mL under the skin every 14 days. Indications: high cholesterol, increased cardiovascular event risk (Patient not taking: Reported on 12/30/2024)    ezetimibe  (ZETIA ) 10 mg tablet TAKE ONE TABLET BY MOUTH EVERY DAY    insulin  lispro (HUMALOG KWIKPEN INSULIN ) 200 unit/mL (3 mL) injection PEN Inject twelve Units under the skin twice daily.    Lactobacillus acidophilus (PROBIOTIC PO) Take 1 tablet by mouth daily.    LANTUS  SOLOSTAR U-100 INSULIN  100 unit/mL (3 mL) subcutaneous PEN Inject thirty Units under the skin twice daily.    losartan  (COZAAR ) 25 mg tablet Take one tablet by mouth daily. Indications: GDMT ACS    metFORMIN -XR (GLUCOPHAGE  XR) 500 mg extended release tablet Take one tablet by mouth twice daily.    metoprolol  tartrate 25 mg tablet Take one tablet by mouth twice daily. Indications: a sudden worsening of angina called acute coronary syndrome    nitroglycerin  (NITROSTAT ) 0.4 mg tablet Place one tablet under tongue every 5 minutes as needed for Chest Pain. Indications: acute attack of angina    other medication Nervive Nerve Relief. Take 1 tablet at bedtime daily.    ranolazine  ER (RANEXA ) 1,000 mg tablet Take one tablet by mouth twice daily.    ticagrelor  (BRILINTA ) 90 mg tablet Take one tablet by mouth twice daily. Indications: blood clot prevention following percutaneous  coronary intervention     Total time spent on today's office visit was 45 minutes.  This includes face-to-face in person visit with patient as well as nonface-to-face time including review of the EMR, outside records, labs, radiologic studies, echocardiogram & other cardiovascular studies, formation of treatment plan, after visit summary, future disposition, and lastly on documentation.              [1]   Social History  Tobacco Use   Smoking Status Never    Passive exposure: Never   Smokeless Tobacco Never

## 2025-01-03 ENCOUNTER — Encounter: Admit: 2025-01-03 | Discharge: 2025-01-03 | Payer: MEDICARE

## 2025-01-04 ENCOUNTER — Encounter: Admit: 2025-01-04 | Discharge: 2025-01-04 | Payer: MEDICARE

## 2025-01-06 ENCOUNTER — Encounter: Admit: 2025-01-06 | Discharge: 2025-01-06 | Payer: MEDICARE

## 2025-01-18 ENCOUNTER — Encounter: Admit: 2025-01-18 | Discharge: 2025-01-18 | Payer: MEDICARE

## 2025-01-18 DIAGNOSIS — Z01810 Encounter for preprocedural cardiovascular examination: Principal | ICD-10-CM

## 2025-01-18 NOTE — Telephone Encounter [36]
 Recevied a call on the nursing line from Nicole Pace in [facility] reporting that patient presented for her schedule rehab appointment and reported to them that she has been having intermittent substernal chest pain for the past week with associated shortness of breath, nausea, and vomiting. Patient states that Nitro improves symptoms. Currently patient is asymptomatic. Nicole Pace states that they are not going to exercise her today. She states that blood pressure is 106/70 and EKG was SR.     Patient's symptoms were discussed today with Dr. Quin in clinic. He recommends for patient to undergo another heart cath.         Recommendations were called to Nicole Pace at cardiac rehab and patient. Both verbalized understanding. Educated patient about signs and symptoms and when to report to the ER. Patient verbalized understanding and has no further questions or concerns at this time.       Flag sent to cath schedulers to coordinate procedure.

## 2025-01-18 NOTE — Telephone Encounter [36]
-----   Message from [place] C sent at 01/18/2025  1:16 PM CST -----  Regarding: LHC  Catheterization (Cath) Handoff Tool ?    ##Important Reminder: Any changes or cancellations that need to be made after the procedure is initially scheduled should be made by the nurse team of the ordering physician.##    Case Requested: LV Cors +/-  Special request for case request (example: valve gradient):   Reason for Cath/Diagnosis: Chest pain, shortness of breath  Date Cath Requested By: within 1 week  Have risks/benefits been documented by provider?   Has the patient been made aware about the need for this procedure by the primary CNC team? Yes  Okay to hold anticoagulation per Adult CV Labs Protocol? Yes   -If yes, do they need Lovenox  bridging? No  Pertinent previous history (Cardiac/Pulmonary/Other): (i.e. previous CABG, HITT, anticoagulation hold):   Allergic to contrast dyes? No  Additional Information: (examples include clearance from other service required for Cath: GI, Hematology, Allergy; Need for Anesthesia; or any other requests):     Reminders  #H&P is needed within 30 days by a Cardiology General Provider.

## 2025-01-18 NOTE — Telephone Encounter [36]
 Called pt to discuss scheduling for heart catheterization.  Pt verified legal name and DOB.  Pt agreeable to 2/6 for Lv Cors+/- with ESH  Labs will be done at [facility] within 14 days of procedure date.  Instructions and meds reviewed and will send via MyChart.  All questions answered and pt verbalized understanding.  Callback number provided.

## 2025-01-18 NOTE — Patient Instructions [37]
 CARDIAC CATHETERIZATION   PRE-ADMISSION INSTRUCTIONS    Patient Name: Nicole Pace  MRN#: [id number]  Date of Birth: 24-Mar-1956 (69 y.o.)  Today's Date: 01/18/2025    PROCEDURE:  You are scheduled for a Coronary Angiogram with possible Angioplasty/Stenting with Dr. Camellia Darting.    PROCEDURE DATE AND ARRIVAL TIME:  Your procedure date is 01/28/25.  You will receive a call from the Cath lab staff between 8:00 a.m. and noon on the business day prior to your procedure to let you know at what time to arrive on the day of your procedure.    Please check in at the Admitting Desk in the [facility] for your procedure.   Address:  [address], [place], [place] [address]    [facility] Entrance and take a right. Continue down the hallway past the Cardiovascular Medicine office. That hall will take you into the [facility]. Check in at the desk on the left side.)     (If you have further questions regarding your arrival time for the CV lab, please call [phone], Option 2, by 3:00pm the day before your procedure. Please leave a message with your name and number, your call will be returned in a timely manner.)    PRE-PROCEDURE APPOINTMENTS:  To be done before 2/5   Pre-Admission lab work required within 14 days of procedure: BMP and CBC [facility]         FOOD AND DRINK INSTRUCTIONS  Nothing to eat after 11p.m. the evening before your procedure. Take your prescription medications with a sip of water  as instructed. No caffeine  for 24 hours prior to your procedure. You will be under moderate sedation for your procedure.  You may drink clear liquids up to an hour before hospital arrival. This will be confirmed by the Cath lab staff the day before your procedure.       Examples of acceptable clear liquids:  -Water    -Ice chips   -Carbonated beverages (caffeine -free)   -Gatorade or other carbohydrate sports drinks (caffeine -free)   -Clear ice pops (without fruit or yogurt)   -Clear juice (apple or cranberry only - no juice with pulp)   -Pedialyte    (Nothing above should have creamer, milk, or pulp. Do NOT drink clear protein drinks, broth, or jello.)    SPECIAL MEDICATION INSTRUCTIONS  Any new prescriptions will be sent to your pharmacy listed on file with us .    Hypoglycemics: metformin  (Glucophage ) -- hold the morning of your procedure.  and empagliflozin (Jardiance) -- hold the morning of your procedure.   Insulin : Please take 1/2 your bedtime dose of Insulin  the night before your procedure and NO Insulin  the morning of your procedure      Is the patient taking oral anticoagulation medication (NOAC, DOAC, or Warfarin)?: No    Does the ordering provider want a Lovenox  bridge before/after cath? N/A    TAKE AM OF PROCEDURE:  Please take either 4 baby aspirin  (4 x 81mg ) or one full strength NON-COATED 325mg  aspirin .  In addition to the aspirin , if you take Clopidogrel  (Plavix ), Prasugrel  (Effient ), or Ticagrelor  (Brilinta ), please take your scheduled dose the morning of the procedure.  TAKE ALL other medications not discussed above as prescribed. If you are prescribed new medications prior to your procedure or are taking medications that aren't on the list below, please let us  know so we can review prior to your procedure.       HOLD ALL erectile dysfunction medications for 3 days,  unless prescribed for pulmonary hypertension.  HOLD ALL over the counter vitamins or supplements on the morning of your procedure.      Additional Instructions  If you wear CPAP, please bring your mask and machine with you to the hospital.    Take a bath or shower with anti-bacterial soap the evening before, or the morning of the procedure.     Bring photo ID and your health insurance card(s).    Arrange for a driver to take you home from the hospital. Please arrange for a friend or family member to take you home from this test. You cannot take a Taxi, [place], or public transportation as there has to be a responsible person to help care for you after sedation    Bring an accurate list of your current medications with you to the hospital (all medications and supplements taken daily).  Please use the medication list below and write in the date and time when you took your last dose before your procedure. Update this list of medications as needed.      Wear comfortable clothes and don't bring valuables, other than photo identification card, with you to the hospital.    Please pack a bag for an overnight stay.     For patients who are staying overnight on the Cardiovascular Treatment and Recovery Unit (CTR), no visitor(s) will be allowed to sleep at the bedside.    Please review your pre-procedure instructions and bring them with you on the day of your procedure.  Call the office at [phone] with any questions. You may ask to speak with Dr. Elspeth Doles nurse.      ALLERGIES  Allergies   Allergen Reactions    Imdur  [Isosorbide  Mononitrate] CHEST TIGHTNESS and HEADACHE    Nuts ANAPHYLAXIS     Tree nuts    Other [Unclassified Drug] ANAPHYLAXIS     Horseradish    Tree Nuts ANAPHYLAXIS     Tree nuts    Artificial Sweetner NAUSEA AND VOMITING    Flu Vaccine [Influenza Virus Vaccines] NAUSEA AND VOMITING and EDEMA     Arm swelling    Morphine HIVES    Tessalon [Benzonatate] ANAPHYLAXIS    Effient  [Prasugrel ] HEADACHE and ITCHING    Levofloxacin ITCHING     Comment on above: IV only may take PO       CURRENT MEDICATIONS  Outpatient Encounter Medications as of 01/18/2025   Medication Sig Dispense Refill    albuterol sulfate (PROAIR HFA) 90 mcg/actuation HFA aerosol inhaler Inhale two puffs by mouth into the lungs every 6 hours as needed for Wheezing or Shortness of Breath.      amitriptyline  (ELAVIL ) 25 mg tablet Take one tablet by mouth at bedtime daily.      aspirin  EC 81 mg tablet Take 1 Tab by mouth daily. Take with food. 90 Tab 3    atorvastatin  (LIPITOR) 80 mg tablet Take one tablet by mouth daily. 90 tablet 3    busPIRone  (BUSPAR ) 5 mg tablet Take one tablet by mouth twice daily.      diphenhydrAMINE  (BENADRYL ) 25 mg PO capsule Take one capsule by mouth every 6 hours as needed.      duloxetine  DR (CYMBALTA ) 30 mg capsule Take one capsule by mouth twice daily.      empagliflozin (JARDIANCE) 10 mg tablet Take one tablet by mouth daily.      EPINEPHrine (EPIPEN) 1 mg/mL injection pen (2-Pack) Inject 0.3 mL into the muscle once as needed. Inject  0.3 mg (1 Pen) into thigh if needed for anaphylactic reaction. May repeat in 5-15 minutes if needed.      evolocumab  (REPATHA  SURECLICK) 140 mg/mL injectable PEN Inject 1 mL under the skin every 14 days. Indications: high cholesterol, increased cardiovascular event risk 2 mL 11    ezetimibe  (ZETIA ) 10 mg tablet TAKE ONE TABLET BY MOUTH EVERY DAY 30 tablet 11    insulin  lispro (HUMALOG KWIKPEN INSULIN ) 200 unit/mL (3 mL) injection PEN Inject twelve Units under the skin twice daily.      Lactobacillus acidophilus (PROBIOTIC PO) Take 1 tablet by mouth daily.      LANTUS  SOLOSTAR U-100 INSULIN  100 unit/mL (3 mL) subcutaneous PEN Inject thirty Units under the skin twice daily.      losartan  (COZAAR ) 25 mg tablet Take one tablet by mouth daily. Indications: GDMT ACS 90 tablet 0    metFORMIN -XR (GLUCOPHAGE  XR) 500 mg extended release tablet Take one tablet by mouth twice daily.      metoprolol  tartrate 25 mg tablet Take one tablet by mouth twice daily. Indications: a sudden worsening of angina called acute coronary syndrome 180 tablet 0    nitroglycerin  (NITROSTAT ) 0.4 mg tablet Place one tablet under tongue every 5 minutes as needed for Chest Pain. Indications: acute attack of angina 25 tablet 3    other medication Nervive Nerve Relief. Take 1 tablet at bedtime daily.      ranolazine  ER (RANEXA ) 1,000 mg tablet Take one tablet by mouth twice daily. 180 tablet 3    ticagrelor  (BRILINTA ) 90 mg tablet Take one tablet by mouth twice daily. Indications: blood clot prevention following percutaneous coronary intervention 180 tablet 1     No facility-administered encounter medications on file as of 01/18/2025.       _________________________________________  Form completed by: Tawni Forge, RN  Date completed: 01/18/2025  Method: Via telephone and sent to MyChart.

## 2025-01-18 NOTE — Progress Notes [1]
 Medicare is listed as patient's primary insurance coverage.  Pre-certification is not required for hospitalizations.

## 2025-01-20 ENCOUNTER — Encounter: Admit: 2025-01-20 | Discharge: 2025-01-20 | Payer: MEDICARE

## 2025-01-20 DIAGNOSIS — Z01810 Encounter for preprocedural cardiovascular examination: Principal | ICD-10-CM

## 2025-01-20 LAB — CBC
HEMATOCRIT: 42
HEMOGLOBIN: 13
MCH: 29
MCHC: 31 — ABNORMAL LOW (ref 32.2–35.5)
MCV: 91
MPV: 10
PLATELET COUNT: 295
RBC COUNT: 4.6
WBC COUNT: 8.3

## 2025-01-21 ENCOUNTER — Encounter: Admit: 2025-01-21 | Discharge: 2025-01-21 | Payer: MEDICARE

## 2025-01-28 ENCOUNTER — Encounter: Admit: 2025-01-28 | Discharge: 2025-01-28 | Payer: MEDICARE
